# Patient Record
Sex: Male | Born: 1980
Health system: Southern US, Community
[De-identification: ages and names within clinical notes are randomized; demographics above are authoritative.]

## PROBLEM LIST (undated history)

## (undated) DIAGNOSIS — K219 Gastro-esophageal reflux disease without esophagitis: Secondary | ICD-10-CM

## (undated) DIAGNOSIS — G5601 Carpal tunnel syndrome, right upper limb: Secondary | ICD-10-CM

## (undated) DIAGNOSIS — Z87442 Personal history of urinary calculi: Secondary | ICD-10-CM

## (undated) DIAGNOSIS — I10 Essential (primary) hypertension: Secondary | ICD-10-CM

## (undated) DIAGNOSIS — Z87898 Personal history of other specified conditions: Secondary | ICD-10-CM

## (undated) DIAGNOSIS — R350 Frequency of micturition: Secondary | ICD-10-CM

## (undated) DIAGNOSIS — E669 Obesity, unspecified: Secondary | ICD-10-CM

## (undated) DIAGNOSIS — K760 Fatty (change of) liver, not elsewhere classified: Secondary | ICD-10-CM

---

## 2009-03-25 ENCOUNTER — Emergency Department (HOSPITAL_COMMUNITY): Admission: EM | Admit: 2009-03-25 | Discharge: 2009-03-25 | Payer: Self-pay | Admitting: Emergency Medicine

## 2010-11-19 LAB — CBC
MCV: 93.8 fL (ref 78.0–100.0)
Platelets: 286 10*3/uL (ref 150–400)
RBC: 4.1 MIL/uL — ABNORMAL LOW (ref 4.22–5.81)
WBC: 10.1 10*3/uL (ref 4.0–10.5)

## 2010-11-19 LAB — POCT I-STAT, CHEM 8
BUN: 14 mg/dL (ref 6–23)
Chloride: 110 mEq/L (ref 96–112)
Creatinine, Ser: 1.1 mg/dL (ref 0.4–1.5)
Glucose, Bld: 114 mg/dL — ABNORMAL HIGH (ref 70–99)
Potassium: 3.7 mEq/L (ref 3.5–5.1)
Sodium: 142 mEq/L (ref 135–145)

## 2010-11-19 LAB — URINALYSIS, ROUTINE W REFLEX MICROSCOPIC
Bilirubin Urine: NEGATIVE
Nitrite: NEGATIVE
Specific Gravity, Urine: 1.027 (ref 1.005–1.030)
Urobilinogen, UA: 0.2 mg/dL (ref 0.0–1.0)
pH: 5.5 (ref 5.0–8.0)

## 2010-11-19 LAB — DIFFERENTIAL
Lymphocytes Relative: 15 % (ref 12–46)
Lymphs Abs: 1.5 10*3/uL (ref 0.7–4.0)
Monocytes Relative: 7 % (ref 3–12)
Neutrophils Relative %: 74 % (ref 43–77)

## 2010-11-19 LAB — URINE MICROSCOPIC-ADD ON

## 2011-08-14 DIAGNOSIS — G5601 Carpal tunnel syndrome, right upper limb: Secondary | ICD-10-CM

## 2011-08-14 HISTORY — DX: Carpal tunnel syndrome, right upper limb: G56.01

## 2011-08-15 ENCOUNTER — Other Ambulatory Visit: Payer: Self-pay | Admitting: Orthopedic Surgery

## 2011-08-16 ENCOUNTER — Other Ambulatory Visit: Payer: Self-pay | Admitting: Orthopedic Surgery

## 2011-08-16 ENCOUNTER — Encounter (HOSPITAL_BASED_OUTPATIENT_CLINIC_OR_DEPARTMENT_OTHER): Payer: Self-pay | Admitting: *Deleted

## 2011-08-21 ENCOUNTER — Ambulatory Visit (HOSPITAL_BASED_OUTPATIENT_CLINIC_OR_DEPARTMENT_OTHER)
Admission: RE | Admit: 2011-08-21 | Payer: BC Managed Care – PPO | Source: Ambulatory Visit | Admitting: Orthopedic Surgery

## 2011-08-21 ENCOUNTER — Encounter (HOSPITAL_BASED_OUTPATIENT_CLINIC_OR_DEPARTMENT_OTHER): Admission: RE | Payer: Self-pay | Source: Ambulatory Visit

## 2011-08-21 HISTORY — DX: Carpal tunnel syndrome, right upper limb: G56.01

## 2011-08-21 HISTORY — DX: Personal history of urinary calculi: Z87.442

## 2011-08-21 SURGERY — CARPAL TUNNEL RELEASE
Anesthesia: Regional | Laterality: Right

## 2013-02-09 ENCOUNTER — Ambulatory Visit: Payer: 59

## 2013-02-09 ENCOUNTER — Ambulatory Visit: Payer: 59 | Admitting: Family Medicine

## 2013-02-09 VITALS — BP 130/78 | HR 81 | Temp 98.7°F | Resp 18 | Ht 71.0 in | Wt 198.0 lb

## 2013-02-09 DIAGNOSIS — M549 Dorsalgia, unspecified: Secondary | ICD-10-CM

## 2013-02-09 DIAGNOSIS — H109 Unspecified conjunctivitis: Secondary | ICD-10-CM

## 2013-02-09 DIAGNOSIS — R8281 Pyuria: Secondary | ICD-10-CM

## 2013-02-09 DIAGNOSIS — R300 Dysuria: Secondary | ICD-10-CM

## 2013-02-09 DIAGNOSIS — R82998 Other abnormal findings in urine: Secondary | ICD-10-CM

## 2013-02-09 LAB — POCT URINALYSIS DIPSTICK
Bilirubin, UA: NEGATIVE
Glucose, UA: NEGATIVE
Ketones, UA: NEGATIVE
Nitrite, UA: NEGATIVE
Protein, UA: NEGATIVE
Spec Grav, UA: 1.025
Urobilinogen, UA: 0.2
pH, UA: 5.5

## 2013-02-09 LAB — POCT UA - MICROSCOPIC ONLY
Casts, Ur, LPF, POC: NEGATIVE
Crystals, Ur, HPF, POC: NEGATIVE
Epithelial cells, urine per micros: NEGATIVE
Mucus, UA: NEGATIVE
Yeast, UA: NEGATIVE

## 2013-02-09 MED ORDER — TOBRAMYCIN 0.3 % OP SOLN: 1.0000 [drp] | OPHTHALMIC | Status: DC

## 2013-02-09 MED ORDER — HYDROCODONE-ACETAMINOPHEN 5-325 MG PO TABS: 1.0000 | ORAL_TABLET | Freq: Four times a day (QID) | ORAL | Status: DC | PRN

## 2013-02-09 MED ORDER — CIPROFLOXACIN HCL 500 MG PO TABS: 500.0000 mg | ORAL_TABLET | Freq: Two times a day (BID) | ORAL | Status: DC

## 2013-02-09 NOTE — Progress Notes (Signed)
Is a 32 year old gentleman who works at a [employer] in [employer]. He comes in with 2 days of back pain permanently on the left and injection of his left eye with mattering.  Patient's had a kidney stone the past and believes that he saw some hematuria 2 days ago. He's now having just dysuria and a mild ache in the left flank. He's had a fever and has no discharge.  Left eye has been red and and irritated the last day. He had mattering this morning. Right eye is not affected. He's able to see fairly well.  Patient usually works a 3-11 shift.  Objective: No acute distress HEENT: Unremarkable with exception of injected left eye and some mild swelling on the edges of the lips. Abdomen: Soft nontender without HSM. No CVA tenderness. Straight-leg raising negative Hip range of motion and knee range of motion are normal. Skin: Warm and dry without rashes  Results for orders placed in visit on 02/09/13  POCT UA - MICROSCOPIC ONLY      Result Value Range   WBC, Ur, HPF, POC 18-25     RBC, urine, microscopic 6-8     Bacteria, U Microscopic 1+     Mucus, UA neg     Epithelial cells, urine per micros neg     Crystals, Ur, HPF, POC neg     Casts, Ur, LPF, POC neg     Yeast, UA neg     [facility] reading (PRIMARY) by  Dr. Milus Glazier:  KUB.  No stone seen  Back pain - Plan: POCT UA - Microscopic Only, POCT urinalysis dipstick, DG Abd 1 View, HYDROcodone-acetaminophen (NORCO) 5-325 MG per tablet  Conjunctivitis - Plan: tobramycin (TOBREX) 0.3 % ophthalmic solution  Pyuria - Plan: Urine culture, ciprofloxacin (CIPRO) 500 MG tablet  Signed, Elvina Sidle, MD

## 2013-02-09 NOTE — Patient Instructions (Addendum)

## 2013-02-10 LAB — URINE CULTURE
Colony Count: NO GROWTH
Organism ID, Bacteria: NO GROWTH

## 2013-06-18 ENCOUNTER — Ambulatory Visit: Payer: 59 | Admitting: Family Medicine

## 2013-06-18 VITALS — BP 124/82 | HR 88 | Temp 98.5°F | Resp 20 | Ht 70.0 in | Wt 208.8 lb

## 2013-06-18 DIAGNOSIS — R21 Rash and other nonspecific skin eruption: Secondary | ICD-10-CM

## 2013-06-18 LAB — POCT CBC
Granulocyte percent: 73.3 %G (ref 37–80)
HCT, POC: 45.4 % (ref 43.5–53.7)
MCH, POC: 32 pg — AB (ref 27–31.2)
MCV: 98.6 fL — AB (ref 80–97)
POC LYMPH PERCENT: 20.3 %L (ref 10–50)
RBC: 4.6 M/uL — AB (ref 4.69–6.13)
RDW, POC: 13.1 %
WBC: 9 10*3/uL (ref 4.6–10.2)

## 2013-06-18 LAB — COMPREHENSIVE METABOLIC PANEL
Albumin: 4.7 g/dL (ref 3.5–5.2)
BUN: 9 mg/dL (ref 6–23)
CO2: 27 mEq/L (ref 19–32)
Glucose, Bld: 90 mg/dL (ref 70–99)
Potassium: 4.7 mEq/L (ref 3.5–5.3)
Sodium: 140 mEq/L (ref 135–145)
Total Protein: 7.7 g/dL (ref 6.0–8.3)

## 2013-06-18 MED ORDER — PREDNISONE 20 MG PO TABS: ORAL_TABLET | ORAL | Status: DC

## 2013-06-18 NOTE — Patient Instructions (Signed)
I will be in touch as soon as I have any more information about your labs.  Please use the prednisone as directed.  You can also use benadryl cream or by mouth as needed for itching . Try to go easy on your skin- avoid any harsh soaps or creams.  Take short, lukewarm showers and pat your skin dry.    If you are getting worse please let me know right away

## 2013-06-18 NOTE — Progress Notes (Addendum)
[facility] [address] [place] [address] [id number] [phone]  Date:  06/18/2013   Name:  BOBBYE Black   DOB:  12/15/1980   MRN:  [id number]  PCP:  No primary provider on file.    Chief Complaint: Rash   History of Present Illness:  [place] Black is a 32 y.o. very pleasant male patient who presents with the following:  He is here today with a rash- he noted onset of an itchy rash on his left trunk about 2 days aog.  It seems to be spreading to other parts of his body.  It is now present across his chest and in both axillae.  Today he noted a few scattered patches on his face.  He did move into a new house about one week ago- otherwise he is not aware of any new allergen exposures.  He is not on any medications, no new foods.  He lives alone.   No lip or tongue swelling, no difficulty breathing, no wheezing.    He did have to crawl under his house recently to change a water filter.    He does admit that he had a sore throat a few days ago- however he did not think that he had a fever and his sx resolved.  His throat now feels fine.    He is generally quite healthy.  He does tend towards itchy, allergic skin and dry skin.    There are no active problems to display for this patient.   Past Medical History  Diagnosis Date  . Carpal tunnel syndrome, right 08/2011  . History of kidney stones 1-2 yrs. ago    History reviewed. No pertinent past surgical history.  History  Substance Use Topics  . Smoking status: Never Smoker   . Smokeless tobacco: Never Used     Comment: quit smoking 2 yrs. ago  . Alcohol Use: Yes     Comment: 2 x/week    Family History  Problem Relation Age of Onset  . Diabetes Paternal Grandmother     No Known Allergies  Medication list has been reviewed and updated.  Current Outpatient Prescriptions on File Prior to Visit  Medication Sig Dispense Refill  . ibuprofen (ADVIL,MOTRIN) 200 MG tablet Take 200 mg by mouth every 6  (six) hours as needed.        . ciprofloxacin (CIPRO) 500 MG tablet Take 1 tablet (500 mg total) by mouth 2 (two) times daily.  20 tablet  0  . HYDROcodone-acetaminophen (NORCO) 5-325 MG per tablet Take 1 tablet by mouth every 6 (six) hours as needed for pain.  30 tablet  0  . tobramycin (TOBREX) 0.3 % ophthalmic solution Place 1 drop into the left eye every 4 (four) hours.  5 mL  0   No current facility-administered medications on file prior to visit.    Review of Systems:  As per HPI- otherwise negative.   Physical Examination: Filed Vitals:   06/18/13 1312  BP: 124/82  Pulse: 88  Temp: 98.5 F (36.9 C)  Resp: 20   Filed Vitals:   06/18/13 1312  Height: 5\' 10"  (1.778 m)  Weight: 208 lb 12.8 oz (94.711 kg)   Body mass index is 29.96 kg/(m^2). Ideal Body Weight: Weight in (lb) to have BMI = 25: 173.9  GEN: WDWN, NAD, Non-toxic, A & O x 3, overweight, looks well HEENT: Atraumatic, Normocephalic. Neck supple. No masses, No LAD.  Bilateral TM wnl, oropharynx normal.  PEERL,EOMI.  No angioedema or lip/ tongue swelling.   Ears and Nose: No external deformity. CV: RRR, No M/G/R. No JVD. No thrill. No extra heart sounds. PULM: CTA B, no wheezes, crackles, rhonchi. No retractions. No resp. distress. No accessory muscle use. Skin: palpable, fine rash over his trunk, more in the axillae.  He also has a few small macules on his face under the left eye.  Not interfering with his globe or lids.  Palms are spared, no vesicles.  No urticaria.   EXTR: No c/c/e NEURO Normal gait.  PSYCH: Normally interactive. Conversant. Not depressed or anxious appearing.  Calm demeanor.   Results for orders placed in visit on 06/18/13  POCT RAPID STREP A (OFFICE)      Result Value Range   Rapid Strep A Screen Negative  Negative  POCT CBC      Result Value Range   WBC 9.0  4.6 - 10.2 K/uL   Lymph, poc 1.8  0.6 - 3.4   POC LYMPH PERCENT 20.3  10 - 50 %L   MID (cbc) 0.6  0 - 0.9   POC MID % 6.4  0 -  12 %M   POC Granulocyte 6.6  2 - 6.9   Granulocyte percent 73.3  37 - 80 %G   RBC 4.60 (*) 4.69 - 6.13 M/uL   Hemoglobin 14.7  14.1 - 18.1 g/dL   HCT, POC 16.1  09.6 - 53.7 %   MCV 98.6 (*) 80 - 97 fL   MCH, POC 32.0 (*) 27 - 31.2 pg   MCHC 32.4  31.8 - 35.4 g/dL   RDW, POC 04.5     Platelet Count, POC 328  142 - 424 K/uL   MPV 10.0  0 - 99.8 fL  POCT SEDIMENTATION RATE      Result Value Range   POCT SED RATE 31 (*) 0 - 22 mm/hr     Assessment and Plan: Rash and nonspecific skin eruption - Plan: POCT rapid strep A, Culture, Group A Strep, predniSONE (DELTASONE) 20 MG tablet, POCT CBC, POCT SEDIMENTATION RATE, Comprehensive metabolic panel, RPR, HIV antibody, Antistreptolysin O titer  Possible strep related rash- await ASO and throat culture.  Will treat with prednisone for presumed allergic eczema/ atopic dermatitis flare.  Will contact him with the rest of his results.  He is to let me know if getting worse or if sx change.    Signed Abbe Amsterdam, MD  Received the rest of his labs, called the check on him.  He is improved, thinks that his sx are going away.  He has a few days of prednisone left.   He will let me know if his rash comes back.   Results for orders placed in visit on 06/18/13  CULTURE, GROUP A STREP      Result Value Range   Organism ID, Bacteria Normal Upper Respiratory Flora     Organism ID, Bacteria No Beta Hemolytic Streptococci Isolated    COMPREHENSIVE METABOLIC PANEL      Result Value Range   Sodium 140  135 - 145 mEq/L   Potassium 4.7  3.5 - 5.3 mEq/L   Chloride 104  96 - 112 mEq/L   CO2 27  19 - 32 mEq/L   Glucose, Bld 90  70 - 99 mg/dL   BUN 9  6 - 23 mg/dL   Creat 4.09  8.11 - 9.14 mg/dL   Total Bilirubin 0.5  0.3 - 1.2 mg/dL   Alkaline  Phosphatase 64  39 - 117 U/L   AST 36  0 - 37 U/L   ALT 64 (*) 0 - 53 U/L   Total Protein 7.7  6.0 - 8.3 g/dL   Albumin 4.7  3.5 - 5.2 g/dL   Calcium 21.3  8.4 - 08.6 mg/dL  RPR      Result Value Range    RPR NON REAC  NON REAC  HIV ANTIBODY (ROUTINE TESTING)      Result Value Range   HIV NON REACTIVE  NON REACTIVE  ANTISTREPTOLYSIN O TITER      Result Value Range   ASO 87  <409 IU/mL  POCT RAPID STREP A (OFFICE)      Result Value Range   Rapid Strep A Screen Negative  Negative  POCT CBC      Result Value Range   WBC 9.0  4.6 - 10.2 K/uL   Lymph, poc 1.8  0.6 - 3.4   POC LYMPH PERCENT 20.3  10 - 50 %L   MID (cbc) 0.6  0 - 0.9   POC MID % 6.4  0 - 12 %M   POC Granulocyte 6.6  2 - 6.9   Granulocyte percent 73.3  37 - 80 %G   RBC 4.60 (*) 4.69 - 6.13 M/uL   Hemoglobin 14.7  14.1 - 18.1 g/dL   HCT, POC 57.8  46.9 - 53.7 %   MCV 98.6 (*) 80 - 97 fL   MCH, POC 32.0 (*) 27 - 31.2 pg   MCHC 32.4  31.8 - 35.4 g/dL   RDW, POC 62.9     Platelet Count, POC 328  142 - 424 K/uL   MPV 10.0  0 - 99.8 fL  POCT SEDIMENTATION RATE      Result Value Range   POCT SED RATE 31 (*) 0 - 22 mm/hr

## 2013-06-19 LAB — RPR

## 2013-06-19 LAB — HIV ANTIBODY (ROUTINE TESTING W REFLEX): HIV: NONREACTIVE

## 2013-06-20 ENCOUNTER — Encounter: Payer: Self-pay | Admitting: Family Medicine

## 2013-06-20 LAB — CULTURE, GROUP A STREP: Organism ID, Bacteria: NORMAL

## 2013-06-21 ENCOUNTER — Encounter: Payer: Self-pay | Admitting: Family Medicine

## 2015-05-23 ENCOUNTER — Encounter: Payer: Self-pay | Admitting: Sports Medicine

## 2015-05-23 ENCOUNTER — Ambulatory Visit (INDEPENDENT_AMBULATORY_CARE_PROVIDER_SITE_OTHER): Payer: 59 | Admitting: Sports Medicine

## 2015-05-23 VITALS — BP 147/94 | HR 72 | Ht 72.0 in | Wt 223.0 lb

## 2015-05-23 DIAGNOSIS — E669 Obesity, unspecified: Secondary | ICD-10-CM | POA: Insufficient documentation

## 2015-05-23 DIAGNOSIS — Z0000 Encounter for general adult medical examination without abnormal findings: Secondary | ICD-10-CM | POA: Diagnosis not present

## 2015-05-23 MED ORDER — PHENTERMINE HCL 37.5 MG PO TABS: ORAL_TABLET | ORAL | Status: DC

## 2015-05-23 NOTE — Assessment & Plan Note (Signed)
Starting phentermine, nutrition referral, exercise prescription given. Return monthly for weight checks and refills. 

## 2015-05-23 NOTE — Assessment & Plan Note (Signed)
Checking routine blood work. Working on weight loss.

## 2015-05-23 NOTE — Progress Notes (Signed)
  Subjective:    CC: Establish care.   HPI:  [place]unter Black is a pleasant 34 year old male, he has struggled with his weight for most of his life, he has tried dieting and exercise but can never seen to lose a significant amount of weight. Symptoms have begun to way on him, and he does endorse some knee pain.  Past medical history, Surgical history, Family history not pertinant except as noted below, Social history, Allergies, and medications have been entered into the medical record, reviewed, and no changes needed.   Review of Systems: No headache, visual changes, nausea, vomiting, diarrhea, constipation, dizziness, abdominal pain, skin rash, fevers, chills, night sweats, swollen lymph nodes, weight loss, chest pain, body aches, joint swelling, muscle aches, shortness of breath, mood changes, visual or auditory hallucinations.  Objective:    General: Well Developed, well nourished, and in no acute distress.  Neuro: Alert and oriented x3, extra-ocular muscles intact, sensation grossly intact.  HEENT: Normocephalic, atraumatic, pupils equal round reactive to light, neck supple, no masses, no lymphadenopathy, thyroid nonpalpable.  Skin: Warm and dry, no rashes noted.  Cardiac: Regular rate and rhythm, no murmurs rubs or gallops.  Respiratory: Clear to auscultation bilaterally. Not using accessory muscles, speaking in full sentences.  Abdominal: Soft, nontender, nondistended, positive bowel sounds, no masses, no organomegaly.  Musculoskeletal: Shoulder, elbow, wrist, hip, knee, ankle stable, and with full range of motion.  Impression and Recommendations:    The patient was counselled, risk factors were discussed, anticipatory guidance given.

## 2015-06-20 ENCOUNTER — Ambulatory Visit: Payer: 59 | Admitting: Sports Medicine

## 2017-10-11 DIAGNOSIS — Z87898 Personal history of other specified conditions: Secondary | ICD-10-CM

## 2017-10-11 HISTORY — DX: Personal history of other specified conditions: Z87.898

## 2017-10-29 ENCOUNTER — Other Ambulatory Visit: Payer: Self-pay

## 2017-10-29 ENCOUNTER — Emergency Department (HOSPITAL_COMMUNITY): Payer: 59

## 2017-10-29 ENCOUNTER — Emergency Department (HOSPITAL_COMMUNITY)
Admission: EM | Admit: 2017-10-29 | Discharge: 2017-10-29 | Disposition: A | Discharge status: Home / Self Care | Payer: 59 | Attending: Emergency Medicine | Admitting: Emergency Medicine

## 2017-10-29 ENCOUNTER — Encounter (HOSPITAL_COMMUNITY): Payer: Self-pay | Admitting: *Deleted

## 2017-10-29 DIAGNOSIS — Z79899 Other long term (current) drug therapy: Secondary | ICD-10-CM | POA: Diagnosis not present

## 2017-10-29 DIAGNOSIS — R339 Retention of urine, unspecified: Secondary | ICD-10-CM

## 2017-10-29 DIAGNOSIS — R1084 Generalized abdominal pain: Secondary | ICD-10-CM | POA: Diagnosis not present

## 2017-10-29 LAB — BASIC METABOLIC PANEL
ANION GAP: 11 (ref 5–15)
BUN: 8 mg/dL (ref 6–20)
CALCIUM: 9.9 mg/dL (ref 8.9–10.3)
CO2: 25 mmol/L (ref 22–32)
CREATININE: 0.99 mg/dL (ref 0.61–1.24)
Chloride: 100 mmol/L — ABNORMAL LOW (ref 101–111)
GFR calc non Af Amer: >60 mL/min (ref >60)
Glucose, Bld: 108 mg/dL — ABNORMAL HIGH (ref 65–99)
Potassium: 3.8 mmol/L (ref 3.5–5.1)
SODIUM: 136 mmol/L (ref 135–145)

## 2017-10-29 LAB — URINALYSIS, ROUTINE W REFLEX MICROSCOPIC
Bilirubin Urine: NEGATIVE
Glucose, UA: NEGATIVE mg/dL
HGB URINE DIPSTICK: NEGATIVE
KETONES UR: NEGATIVE mg/dL
Leukocytes, UA: NEGATIVE
Nitrite: POSITIVE — AB
PROTEIN: NEGATIVE mg/dL
Specific Gravity, Urine: 1.014 (ref 1.005–1.030)
pH: 5 (ref 5.0–8.0)

## 2017-10-29 MED ORDER — AZITHROMYCIN 250 MG PO TABS
1000.0000 mg | ORAL_TABLET | Freq: Once | ORAL | Status: AC
  Administered 2017-10-29: 1000 mg via ORAL
  Filled 2017-10-29: qty 4

## 2017-10-29 MED ORDER — LIDOCAINE HCL (PF) 1 % IJ SOLN
INTRAMUSCULAR | Status: AC
  Administered 2017-10-29: 0.9 mL
  Filled 2017-10-29: qty 5

## 2017-10-29 MED ORDER — KETOROLAC TROMETHAMINE 30 MG/ML IJ SOLN
15.0000 mg | Freq: Once | INTRAMUSCULAR | Status: AC
  Administered 2017-10-29: 15 mg via INTRAMUSCULAR
  Filled 2017-10-29: qty 1

## 2017-10-29 MED ORDER — CEFTRIAXONE SODIUM 250 MG IJ SOLR
250.0000 mg | Freq: Once | INTRAMUSCULAR | Status: AC
  Administered 2017-10-29: 250 mg via INTRAMUSCULAR
  Filled 2017-10-29: qty 250

## 2017-10-29 MED ORDER — LIDOCAINE HCL (PF) 1 % IJ SOLN
0.9000 mL | Freq: Once | INTRAMUSCULAR | Status: AC
Start: 2017-10-29 — End: 2017-10-29
  Administered 2017-10-29: 0.9 mL

## 2017-10-29 NOTE — ED Triage Notes (Signed)
Pt reports ongoing difficulty urinating. Hx of kidney stones. Pt has been to pcp and had all negative tests. Still has retention, has mild burning once able to urinate.

## 2017-10-29 NOTE — ED Provider Notes (Signed)
[facility] EMERGENCY DEPARTMENT Provider Note   CSN: [id number] Arrival date & time: 10/29/17  4098     History   Chief Complaint Chief Complaint  Patient presents with  . Urinary Retention    HPI [place] Black is a 37 y.o. male.  HPI Presents with concern of difficulty urinating. Patient notes that over the past year or so he has had several similar episodes, and is currently experiencing the severe version of a typical event. Now, over the past few days he has had difficulty initiating a urinary stream, and today, in spite of substantial pressure, has had minimal urine production. There is some associated abdominal fullness in the lower mid abdomen, but no other true abdominal pain. He continues to have bowel movements regularly. He did have a fever 3 days ago, but none in the past 48 hours. He has a history of kidney stone in the distant past, has not seen a urologist. He did see a primary care physician last week, had urinalysis, urine culture, both reportedly unremarkable. During this exacerbation, no relief with anything, and there was no clear precipitating event. Past Medical History:  Diagnosis Date  . Carpal tunnel syndrome, right 08/2011  . History of kidney stones 1-2 yrs. ago    Patient Active Problem List   Diagnosis Date Noted  . Annual physical exam 05/23/2015  . Obesity 05/23/2015    History reviewed. No pertinent surgical history.     Home Medications    Prior to Admission medications   Medication Sig Start Date End Date Taking? Authorizing Provider  lisinopril-hydrochlorothiazide (PRINZIDE,ZESTORETIC) 20-12.5 MG tablet Take 1 tablet by mouth daily. 10/22/17  Yes [provider]  phentermine (ADIPEX-P) 37.5 MG tablet One tab by mouth qAM Patient not taking: Reported on 10/29/2017 05/23/15   Monica Becton, MD  predniSONE (DELTASONE) 20 MG tablet Take 2 pills a day for 3 days, then 1 pill a day for 3 days Patient  not taking: Reported on 10/29/2017 06/18/13   Copland, Gwenlyn Found, MD  Patient recently started hydrochlorothiazide, lisinopril combination antihypertensive.  Family History Family History  Problem Relation Age of Onset  . Diabetes Paternal Grandmother     Social History Social History   Tobacco Use  . Smoking status: Never Smoker  . Smokeless tobacco: Former Engineer, water Use Topics  . Alcohol use: Yes    Alcohol/week: 6.0 - 9.0 oz    Types: 10 - 15 Cans of beer per week    Comment: 2 x/week  . Drug use: No   Patient is heterosexual, has one recent sexual encounter, unprotected sex, usual partner.  Allergies   Patient has no known allergies.   Review of Systems Review of Systems  Constitutional:       Per HPI, otherwise negative  HENT:       Per HPI, otherwise negative  Respiratory:       Per HPI, otherwise negative  Cardiovascular:       Per HPI, otherwise negative  Gastrointestinal: Negative for vomiting.  Endocrine:       Negative aside from HPI  Genitourinary:       Neg aside from HPI   Musculoskeletal:       Per HPI, otherwise negative  Skin: Negative.   Neurological: Negative for syncope.     Physical Exam Updated Vital Signs BP 121/75 (BP Location: Right Arm)   Pulse 81   Temp 98.7 F (37.1 C) (Oral)   Resp 18  Ht 5\' 10"  (1.778 m)   Wt 92.5 kg (204 lb)   SpO2 100%   BMI 29.27 kg/m   Physical Exam  Constitutional: He is oriented to person, place, and time. He appears well-developed. No distress.  HENT:  Head: Normocephalic and atraumatic.  Eyes: Conjunctivae and EOM are normal.  Pulmonary/Chest: Effort normal. No respiratory distress.  Abdominal: He exhibits no distension.  Minimal tenderness about the suprapubic area, no guarding, or no rebound  Musculoskeletal: He exhibits no edema.  Neurological: He is alert and oriented to person, place, and time.  Skin: Skin is warm and dry.  Psychiatric: He has a normal mood and affect.  Nursing  note and vitals reviewed.    ED Treatments / Results  Labs (all labs ordered are listed, but only abnormal results are displayed) Labs Reviewed  URINALYSIS, ROUTINE W REFLEX MICROSCOPIC - Abnormal; Notable for the following components:      Result Value   Color, Urine AMBER (*)    Nitrite POSITIVE (*)    Bacteria, UA RARE (*)    Squamous Epithelial / LPF 0-5 (*)    All other components within normal limits  BASIC METABOLIC PANEL - Abnormal; Notable for the following components:   Chloride 100 (*)    Glucose, Bld 108 (*)    All other components within normal limits  GC/CHLAMYDIA PROBE AMP ([place]) NOT AT [facility]    EKG  EKG Interpretation None       Radiology Ct Renal Stone Study  Result Date: 10/29/2017 CLINICAL DATA:  [age] year old male with left flank pain yesterday. Unable to urinate today. History kidney stones. Initial encounter. EXAM: CT ABDOMEN AND PELVIS WITHOUT CONTRAST TECHNIQUE: Multidetector CT imaging of the abdomen and pelvis was performed following the standard protocol without IV contrast. COMPARISON:  05/09/2015 CT. FINDINGS: Lower chest: No worrisome lung base abnormality. Heart size within normal limits. Hepatobiliary: Enlarged slightly fatty infiltrated liver spanning over 18.5 cm. Taking into account limitation by non contrast imaging, no focal hepatic lesion. Partially contracted gallbladder without calcified gallstone. Pancreas: Taking into account limitation by non contrast imaging, no worrisome pancreatic mass or inflammation. Spleen: Taking into account limitation by non contrast imaging, no splenic mass or enlargement. Adrenals/Urinary Tract: No renal or ureteral obstructing stone or hydronephrosis. Taking into account limitation by non contrast imaging, no renal or adrenal worrisome lesion. Noncontrast imaging of the urinary bladder unremarkable. Stomach/Bowel: No extraluminal bowel inflammatory process. Under distended distal esophagus limiting evaluation.  Vascular/Lymphatic: No aortic aneurysm.  No adenopathy. Reproductive: Tiny prostatic gland calcification. No calcification seen along urethra. Other: No free air or bowel containing hernia. Musculoskeletal: No worrisome osseous abnormality. IMPRESSION: No renal or ureteral obstructing stone or hydronephrosis. No bladder stone or urethral calculus noted. Enlarged slightly fatty infiltrated liver spanning over 18.5 cm. Electronically Signed   By: Lacy DuverneySteven  Olson M.D.   On: 10/29/2017 14:06    Procedures Procedures (including critical care time)  Medications Ordered in ED Medications  cefTRIAXone (ROCEPHIN) injection 250 mg (not administered)  azithromycin (ZITHROMAX) tablet 1,000 mg (not administered)  ketorolac (TORADOL) 30 MG/ML injection 15 mg (not administered)     Initial Impression / Assessment and Plan / ED Course  I have reviewed the triage vital signs and the nursing notes.  Pertinent labs & imaging results that were available during my care of the patient were reviewed by me and considered in my medical decision making (see chart for details).     3:55 PM Patient awake and alert,  in no distress per He has had additional episodes of urination, again with difficulty initiating a stream, incomplete voiding. Urinalysis unremarkable, aside from nitrates, but the patient was previously evaluated with urinalysis with culture which was normal. Culture will be sent. He and I discussed possibilities for his difficulty with urination, including infectious urethritis, neurogenic dysfunction, recent passage of kidney stone. I discussed the option of placement of a Foley catheter, with initiation of anti-inflammatories, urology follow-up. Patient opted for empiric treatment for infection, close outpatient follow-up. As he can initiate a urinary stream, has no evidence for renal dysfunction, but this is a reasonable alternative. Patient acknowledged the importance of return precautions,  follow-up instructions, and after provision of azithromycin, ceftriaxone, Toradol, he was discharged in stable condition with close outpatient follow-up.  Final Clinical Impressions(s) / ED Diagnoses  Urinary retention   Gerhard Munch, MD 10/29/17 1557

## 2017-10-29 NOTE — Discharge Instructions (Signed)
As discussed, you have been diagnosed with urinary retention. This is presumably due to urethritis, but requires additional evaluation and management. Please monitor your condition carefully, take ibuprofen 400 mg, 3 times daily for the next 3 days, and be sure to follow-up with our urology colleagues.  Return here or to our affiliated emergency department for concerning changes, including inability to urinate.

## 2017-10-29 NOTE — ED Notes (Signed)
Bladder scan 327 ml after pt attempted to go to the bathroom.  Pt stated he was only able to urinate a little.

## 2018-04-08 ENCOUNTER — Other Ambulatory Visit: Payer: Self-pay | Admitting: Urology

## 2018-04-18 ENCOUNTER — Encounter (HOSPITAL_BASED_OUTPATIENT_CLINIC_OR_DEPARTMENT_OTHER): Payer: Self-pay

## 2018-04-21 ENCOUNTER — Encounter (HOSPITAL_BASED_OUTPATIENT_CLINIC_OR_DEPARTMENT_OTHER): Payer: Self-pay

## 2018-04-21 ENCOUNTER — Other Ambulatory Visit: Payer: Self-pay

## 2018-04-21 NOTE — Progress Notes (Signed)
Spoke with:  Hunter Black NPO:  After Midnight, no gum, candy, or mints  Arrival time:  [id number] Labs: Istat 8, EKG AM medications: None Pre op orders: Yes Ride home:Hunter Black ([place]) [phone]

## 2018-04-27 NOTE — Anesthesia Preprocedure Evaluation (Addendum)
Anesthesia Evaluation  Patient identified by MRN, date of birth, ID band Patient awake    Reviewed: Allergy & Precautions, NPO status   Airway Mallampati: II  TM Distance: >3 FB Neck ROM: Full    Dental no notable dental hx. (+) Teeth Intact, Dental Advisory Given,    Pulmonary former smoker,    Pulmonary exam normal breath sounds clear to auscultation       Cardiovascular Exercise Tolerance: Good hypertension, Pt. on medications negative cardio ROS Normal cardiovascular exam Rhythm:Regular Rate:Normal     Neuro/Psych negative neurological ROS  negative psych ROS   GI/Hepatic GERD  Medicated and Controlled,  Endo/Other    Renal/GU    Urethral Stricture    Musculoskeletal   Abdominal   Peds  Hematology negative hematology ROS (+)   Anesthesia Other Findings   Reproductive/Obstetrics                          Anesthesia Physical Anesthesia Plan  ASA: II  Anesthesia Plan: General   Post-op Pain Management:    Induction: Intravenous  PONV Risk Score and Plan: Treatment may vary due to age or medical condition, Ondansetron, Dexamethasone and Scopolamine patch - Pre-op  Airway Management Planned: LMA  Additional Equipment:   Intra-op Plan:   Post-operative Plan:   Informed Consent: I have reviewed the patients History and Physical, chart, labs and discussed the procedure including the risks, benefits and alternatives for the proposed anesthesia with the patient or authorized representative who has indicated his/her understanding and acceptance.   Dental advisory given  Plan Discussed with:   Anesthesia Plan Comments:         Anesthesia Quick Evaluation
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
## 2018-04-28 ENCOUNTER — Ambulatory Visit (HOSPITAL_BASED_OUTPATIENT_CLINIC_OR_DEPARTMENT_OTHER)
Admission: RE | Admit: 2018-04-28 | Discharge: 2018-04-28 | Disposition: A | Discharge status: Home / Self Care | Payer: 59 | Source: Ambulatory Visit | Attending: Urology | Admitting: Urology

## 2018-04-28 ENCOUNTER — Other Ambulatory Visit: Payer: Self-pay

## 2018-04-28 ENCOUNTER — Encounter (HOSPITAL_BASED_OUTPATIENT_CLINIC_OR_DEPARTMENT_OTHER): Payer: Self-pay | Admitting: *Deleted

## 2018-04-28 ENCOUNTER — Ambulatory Visit (HOSPITAL_BASED_OUTPATIENT_CLINIC_OR_DEPARTMENT_OTHER): Payer: 59 | Admitting: Anesthesiology

## 2018-04-28 ENCOUNTER — Encounter (HOSPITAL_BASED_OUTPATIENT_CLINIC_OR_DEPARTMENT_OTHER)
Admission: RE | Disposition: A | Discharge status: Home / Self Care | Payer: Self-pay | Source: Ambulatory Visit | Attending: Urology

## 2018-04-28 DIAGNOSIS — K219 Gastro-esophageal reflux disease without esophagitis: Secondary | ICD-10-CM | POA: Insufficient documentation

## 2018-04-28 DIAGNOSIS — R3914 Feeling of incomplete bladder emptying: Secondary | ICD-10-CM | POA: Insufficient documentation

## 2018-04-28 DIAGNOSIS — I10 Essential (primary) hypertension: Secondary | ICD-10-CM | POA: Diagnosis not present

## 2018-04-28 DIAGNOSIS — R310 Gross hematuria: Secondary | ICD-10-CM | POA: Diagnosis not present

## 2018-04-28 DIAGNOSIS — N35912 Unspecified bulbous urethral stricture, male: Secondary | ICD-10-CM

## 2018-04-28 DIAGNOSIS — Z87891 Personal history of nicotine dependence: Secondary | ICD-10-CM | POA: Diagnosis not present

## 2018-04-28 DIAGNOSIS — N35812 Other urethral bulbous stricture, male: Secondary | ICD-10-CM | POA: Insufficient documentation

## 2018-04-28 HISTORY — DX: Obesity, unspecified: E66.9

## 2018-04-28 HISTORY — PX: CYSTOSCOPY WITH RETROGRADE URETHROGRAM: SHX6309

## 2018-04-28 HISTORY — DX: Fatty (change of) liver, not elsewhere classified: K76.0

## 2018-04-28 HISTORY — DX: Essential (primary) hypertension: I10

## 2018-04-28 HISTORY — DX: Personal history of other specified conditions: Z87.898

## 2018-04-28 HISTORY — DX: Gastro-esophageal reflux disease without esophagitis: K21.9

## 2018-04-28 LAB — POCT I-STAT, CHEM 8
BUN: 8 mg/dL (ref 6–20)
Calcium, Ion: 1.23 mmol/L (ref 1.15–1.40)
Chloride: 98 mmol/L (ref 98–111)
Creatinine, Ser: 1.2 mg/dL (ref 0.61–1.24)
Glucose, Bld: 103 mg/dL — ABNORMAL HIGH (ref 70–99)
HCT: 45 % (ref 39.0–52.0)
Hemoglobin: 15.3 g/dL (ref 13.0–17.0)
Potassium: 3.8 mmol/L (ref 3.5–5.1)
Sodium: 137 mmol/L (ref 135–145)
TCO2: 30 mmol/L (ref 22–32)

## 2018-04-28 SURGERY — CYSTOSCOPY WITH RETROGRADE URETHROGRAM
Anesthesia: General | Site: Bladder

## 2018-04-28 MED ORDER — ONDANSETRON HCL 4 MG/2ML IJ SOLN
INTRAMUSCULAR | Status: DC | PRN
  Administered 2018-04-28: 4 mg via INTRAVENOUS

## 2018-04-28 MED ORDER — ONDANSETRON HCL 4 MG/2ML IJ SOLN
INTRAMUSCULAR | Status: AC
  Filled 2018-04-28: qty 2

## 2018-04-28 MED ORDER — FENTANYL CITRATE (PF) 100 MCG/2ML IJ SOLN
INTRAMUSCULAR | Status: AC
  Filled 2018-04-28: qty 4

## 2018-04-28 MED ORDER — MIDAZOLAM HCL 2 MG/2ML IJ SOLN
INTRAMUSCULAR | Status: DC | PRN
  Administered 2018-04-28: 2 mg via INTRAVENOUS

## 2018-04-28 MED ORDER — CIPROFLOXACIN IN D5W 400 MG/200ML IV SOLN
INTRAVENOUS | Status: AC
  Filled 2018-04-28: qty 200

## 2018-04-28 MED ORDER — LACTATED RINGERS IV SOLN
INTRAVENOUS | Status: DC
  Administered 2018-04-28: 08:00:00 via INTRAVENOUS
  Filled 2018-04-28: qty 1000

## 2018-04-28 MED ORDER — PHENAZOPYRIDINE HCL 200 MG PO TABS: 200.0000 mg | ORAL_TABLET | Freq: Three times a day (TID) | ORAL | 0 refills | Status: DC | PRN

## 2018-04-28 MED ORDER — HYDROCODONE-ACETAMINOPHEN 7.5-325 MG PO TABS
1.0000 | ORAL_TABLET | Freq: Once | ORAL | Status: DC | PRN
  Filled 2018-04-28: qty 1

## 2018-04-28 MED ORDER — HYDROMORPHONE HCL 1 MG/ML IJ SOLN
0.2500 mg | INTRAMUSCULAR | Status: DC | PRN
  Filled 2018-04-28: qty 0.5

## 2018-04-28 MED ORDER — IOHEXOL 300 MG/ML  SOLN
INTRAMUSCULAR | Status: DC | PRN
  Administered 2018-04-28: 20 mL

## 2018-04-28 MED ORDER — PROMETHAZINE HCL 25 MG/ML IJ SOLN
6.2500 mg | INTRAMUSCULAR | Status: DC | PRN
  Filled 2018-04-28: qty 1

## 2018-04-28 MED ORDER — LIDOCAINE 2% (20 MG/ML) 5 ML SYRINGE
INTRAMUSCULAR | Status: DC | PRN
  Administered 2018-04-28: 100 mg via INTRAVENOUS

## 2018-04-28 MED ORDER — MEPERIDINE HCL 25 MG/ML IJ SOLN
6.2500 mg | INTRAMUSCULAR | Status: DC | PRN
  Filled 2018-04-28: qty 1

## 2018-04-28 MED ORDER — HYDROCODONE-ACETAMINOPHEN 10-325 MG PO TABS: 1.0000 | ORAL_TABLET | ORAL | 0 refills | Status: DC | PRN

## 2018-04-28 MED ORDER — LIDOCAINE 2% (20 MG/ML) 5 ML SYRINGE
INTRAMUSCULAR | Status: AC
  Filled 2018-04-28: qty 5

## 2018-04-28 MED ORDER — MIDAZOLAM HCL 2 MG/2ML IJ SOLN
INTRAMUSCULAR | Status: AC
  Filled 2018-04-28: qty 2

## 2018-04-28 MED ORDER — STERILE WATER FOR IRRIGATION IR SOLN
Status: DC | PRN
  Administered 2018-04-28: 3000 mL

## 2018-04-28 MED ORDER — DEXAMETHASONE SODIUM PHOSPHATE 10 MG/ML IJ SOLN
INTRAMUSCULAR | Status: AC
  Filled 2018-04-28: qty 1

## 2018-04-28 MED ORDER — PROPOFOL 10 MG/ML IV BOLUS
INTRAVENOUS | Status: DC | PRN
  Administered 2018-04-28: 200 mg via INTRAVENOUS

## 2018-04-28 MED ORDER — DEXAMETHASONE SODIUM PHOSPHATE 10 MG/ML IJ SOLN
INTRAMUSCULAR | Status: DC | PRN
  Administered 2018-04-28: 5 mg via INTRAVENOUS

## 2018-04-28 MED ORDER — ACETAMINOPHEN 10 MG/ML IV SOLN
1000.0000 mg | Freq: Once | INTRAVENOUS | Status: DC | PRN
  Filled 2018-04-28: qty 100

## 2018-04-28 MED ORDER — PROPOFOL 10 MG/ML IV BOLUS
INTRAVENOUS | Status: AC
  Filled 2018-04-28: qty 20

## 2018-04-28 MED ORDER — CIPROFLOXACIN IN D5W 400 MG/200ML IV SOLN
400.0000 mg | Freq: Once | INTRAVENOUS | Status: AC
  Administered 2018-04-28: 400 mg via INTRAVENOUS
  Filled 2018-04-28: qty 200

## 2018-04-28 MED ORDER — FENTANYL CITRATE (PF) 100 MCG/2ML IJ SOLN
INTRAMUSCULAR | Status: DC | PRN
  Administered 2018-04-28 (×2): 50 ug via INTRAVENOUS

## 2018-04-28 MED FILL — HYDROCODON-APAP 10-325: 10-325 | 1 days supply | Qty: 8 | Fill #0

## 2018-04-28 SURGICAL SUPPLY — 27 items
BAG DRAIN URO-CYSTO SKYTR STRL (DRAIN) ×2 IMPLANT
BAG DRN ANRFLXCHMBR STRAP LEK (BAG) ×1
BAG DRN UROCATH (DRAIN) ×1
BAG URINE LEG 19OZ MD ST LTX (BAG) ×1 IMPLANT
BALLN NEPHROSTOMY (BALLOONS) ×2
BALLOON NEPHROSTOMY (BALLOONS) IMPLANT
CATH FOLEY 2WAY SLVR  5CC 14FR (CATHETERS) ×1
CATH FOLEY 2WAY SLVR  5CC 16FR (CATHETERS) ×1
CATH FOLEY 2WAY SLVR 5CC 14FR (CATHETERS) IMPLANT
CATH FOLEY 2WAY SLVR 5CC 16FR (CATHETERS) IMPLANT
CLOTH BEACON ORANGE TIMEOUT ST (SAFETY) ×4 IMPLANT
GLOVE BIO SURGEON STRL SZ7 (GLOVE) ×2 IMPLANT
GLOVE BIO SURGEON STRL SZ8 (GLOVE) ×2 IMPLANT
GLOVE BIOGEL PI IND STRL 7.5 (GLOVE) IMPLANT
GLOVE BIOGEL PI INDICATOR 7.5 (GLOVE) ×2
GOWN STRL REUS W/ TWL XL LVL3 (GOWN DISPOSABLE) ×1 IMPLANT
GOWN STRL REUS W/TWL LRG LVL3 (GOWN DISPOSABLE) ×2 IMPLANT
GOWN STRL REUS W/TWL XL LVL3 (GOWN DISPOSABLE) ×2
GUIDEWIRE STR DUAL SENSOR (WIRE) ×1 IMPLANT
IV NS IRRIG 3000ML ARTHROMATIC (IV SOLUTION) IMPLANT
KIT TURNOVER CYSTO (KITS) ×2 IMPLANT
MANIFOLD NEPTUNE II (INSTRUMENTS) ×1 IMPLANT
PACK CYSTO (CUSTOM PROCEDURE TRAY) ×2 IMPLANT
SYR 20CC LL (SYRINGE) IMPLANT
SYR 30ML LL (SYRINGE) IMPLANT
TUBE CONNECTING 12X1/4 (SUCTIONS) ×1 IMPLANT
WATER STERILE IRR 3000ML UROMA (IV SOLUTION) ×2 IMPLANT

## 2018-04-28 NOTE — Anesthesia Postprocedure Evaluation (Signed)
Anesthesia Post Note  Patient: Hunter LofflerDerek M Black  Procedure(s) Performed: CYSTOSCOPY WITH RETROGRADE URETHROGRAM/ BALLOON DILATION (N/A Bladder)     Patient location during evaluation: PACU Anesthesia Type: General Level of consciousness: awake and alert Pain management: pain level controlled Vital Signs Assessment: post-procedure vital signs reviewed and stable Respiratory status: spontaneous breathing, nonlabored ventilation, respiratory function stable and patient connected to nasal cannula oxygen Cardiovascular status: blood pressure returned to baseline and stable Postop Assessment: no apparent nausea or vomiting Anesthetic complications: no    Last Vitals:  Vitals:   04/28/18 0930 04/28/18 0945  BP:  115/85  Pulse: 75 78  Resp: (!) 22 17  Temp:    SpO2: 97% 93%    Last Pain:  Vitals:   04/28/18 0930  TempSrc:   PainSc: 0-No pain                 Trevor IhaStephen A Jasmen Emrich
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
## 2018-04-28 NOTE — Transfer of Care (Signed)
Immediate Anesthesia Transfer of Care Note  Patient: Hunter Black  Procedure(s) Performed: CYSTOSCOPY WITH RETROGRADE URETHROGRAM/ BALLOON DILATION (N/A Bladder)  Patient Location: PACU  Anesthesia Type:General  Level of Consciousness: awake, alert , oriented and patient cooperative  Airway & Oxygen Therapy: Patient Spontanous Breathing and Patient connected to nasal cannula oxygen  Post-op Assessment: Report given to RN and Post -op Vital signs reviewed and stable  Post vital signs: Reviewed and stable  Last Vitals:  Vitals Value Taken Time  BP 125/88 04/28/2018  9:17 AM  Temp    Pulse 81 04/28/2018  9:19 AM  Resp    SpO2 97 % 04/28/2018  9:19 AM  Vitals shown include unvalidated device data.  Last Pain:  Vitals:   04/28/18 0709  TempSrc:   PainSc: 0-No pain      Patients Stated Pain Goal: 7 (04/28/18 0709)  Complications: No apparent anesthesia complications

## 2018-04-28 NOTE — Anesthesia Procedure Notes (Signed)
Procedure Name: LMA Insertion Date/Time: 04/28/2018 8:49 AM Performed by: Tyrone NineSauve, Tomi Paddock F, CRNA Pre-anesthesia Checklist: Patient identified, Timeout performed, Emergency Drugs available, Suction available and Patient being monitored Patient Re-evaluated:Patient Re-evaluated prior to induction Oxygen Delivery Method: Circle system utilized Induction Type: IV induction Ventilation: Mask ventilation without difficulty LMA: LMA inserted LMA Size: 4.0 Number of attempts: 1 Placement Confirmation: CO2 detector,  positive ETCO2 and breath sounds checked- equal and bilateral Tube secured with: Tape Dental Injury: Teeth and Oropharynx as per pre-operative assessment

## 2018-04-28 NOTE — Discharge Instructions (Addendum)
Cystoscopy patient instructions  Following a cystoscopy, a catheter (a flexible rubber tube) is sometimes left in place to empty the bladder. This may cause some discomfort or a feeling that you need to urinate. Your doctor determines the period of time that the catheter will be left in place. You may have bloody urine for two to three days (Call your doctor if the amount of bleeding increases or does not subside).  You may pass blood clots in your urine, especially if you had a biopsy. It is not unusual to pass small blood clots and have some bloody urine a couple of weeks after your cystoscopy. Again, call your doctor if the bleeding does not subside. You may have: Dysuria (painful urination) Frequency (urinating often) Urgency (strong desire to urinate)  Removal of catheter Remove the foley catheter after 24 hours ( day after the procedure).can be done easily by cutting the side port of the catheter, which will allow the balloon to deflate.  You will see 1-2 teaspoons of clear water as the balloon deflates and then the catheter can be slid out without difficulty.        Cut here    These symptoms are common especially if medicine is instilled into the bladder or a ureteral stent is placed. Avoiding alcohol and caffeine, such as coffee, tea, and chocolate, may help relieve these symptoms. Drink plenty of water, unless otherwise instructed. Your doctor may also prescribe an antibiotic or other medicine to reduce these symptoms.  Cystoscopy results are available soon after the procedure; biopsy results usually take two to four days. Your doctor will discuss the results of your exam with you. Before you go home, you will be given specific instructions for follow-up care. Special Instructions:  1 If you are going home with a catheter in place do not take a tub bath until removed by your doctor.  2 You may resume your normal activities.  3 Do not drive or operate machinery if you are taking  narcotic pain medicine.  4 Be sure to keep all follow-up appointments with your doctor.   5 Call Your Doctor If: The catheter is not draining  You have severe pain  You are unable to urinate  You have a fever over 101  You have severe bleeding          Indwelling Urinary Catheter Care, Adult Take good care of your catheter to keep it working and to prevent problems. How to wear your catheter Attach your catheter to your leg with tape (adhesive tape) or a leg strap. Make sure it is not too tight. If you use tape, remove any bits of tape that are already on the catheter. How to wear a drainage bag You should have: A large overnight bag. A small leg bag.  Overnight Bag You may wear the overnight bag at any time. Always keep the bag below the level of your bladder but off the floor. When you sleep, put a clean plastic bag in a wastebasket. Then hang the bag inside the wastebasket. Leg Bag Never wear the leg bag at night. Always wear the leg bag below your knee. Keep the leg bag secure with a leg strap or tape. How to care for your skin Clean the skin around the catheter at least once every day. Shower every day. Do not take baths. Put creams, lotions, or ointments on your genital area only as told by your doctor. Do not use powders, sprays, or lotions on your genital  area. How to clean your catheter and your skin Wash your hands with soap and water. Wet a washcloth in warm water and gentle (mild) soap. Use the washcloth to clean the skin where the catheter enters your body. Clean downward and wipe away from the catheter in small circles. Do not wipe toward the catheter. Pat the area dry with a clean towel. Make sure to clean off all soap. How to care for your drainage bags Empty your drainage bag when it is ?- full or at least 2-3 times a day. Replace your drainage bag once a month or sooner if it starts to smell bad or look dirty. Do not clean your drainage bag unless told by your  doctor. Emptying a drainage bag  Supplies Needed Rubbing alcohol. Gauze pad or cotton ball. Tape or a leg strap.  Steps Wash your hands with soap and water. Separate (detach) the bag from your leg. Hold the bag over the toilet or a clean container. Keep the bag below your hips and bladder. This stops pee (urine) from going back into the tube. Open the pour spout at the bottom of the bag. Empty the pee into the toilet or container. Do not let the pour spout touch any surface. Put rubbing alcohol on a gauze pad or cotton ball. Use the gauze pad or cotton ball to clean the pour spout. Close the pour spout. Attach the bag to your leg with tape or a leg strap. Wash your hands.  Changing a drainage bag Supplies Needed Alcohol wipes. A clean drainage bag. Adhesive tape or a leg strap.  Steps Wash your hands with soap and water. Separate the dirty bag from your leg. Pinch the rubber catheter with your fingers so that pee does not spill out. Separate the catheter tube from the drainage tube where these tubes connect (at the connection valve). Do not let the tubes touch any surface. Clean the end of the catheter tube with an alcohol wipe. Use a different alcohol wipe to clean the end of the drainage tube. Connect the catheter tube to the drainage tube of the clean bag. Attach the new bag to the leg with adhesive tape or a leg strap. Wash your hands.  How to prevent infection and other problems Never pull on your catheter or try to remove it. Pulling can damage tissue in your body. Always wash your hands before and after touching your catheter. If a leg strap gets wet, replace it with a dry one. Drink enough fluids to keep your pee clear or pale yellow, or as told by your doctor. Do not let the drainage bag or tubing touch the floor. Wear cotton underwear. If you are male, wipe from front to back after you poop (have a bowel movement). Check on the catheter often to make sure it  works and the tubing is not twisted. Get help if: Your pee is cloudy. Your pee smells unusually bad. Your pee is not draining into the bag. Your tube gets clogged. Your catheter starts to leak. Your bladder feels full. Get help right away if: You have redness, swelling, or pain where the catheter enters your body. You have fluid, pus, or a bad smell coming from the area where the catheter enters your body. The area where the catheter enters your body feels warm. You have a fever. You have pain in your: Stomach (abdomen). Legs. Lower back. Bladder. You see blood fill the catheter. Your pee is pink or red. You feel sick  to your stomach (nauseous). You throw up (vomit). You have chills. Your catheter gets pulled out. This information is not intended to replace advice given to you by your health care provider. Make sure you discuss any questions you have with your health care provider. Document Released: 11/24/2012 Document Revised: 06/27/2016 Document Reviewed: 01/12/2014 Elsevier Interactive Patient Education  2018 [employer].   Post Anesthesia Home Care Instructions  Activity: Get plenty of rest for the remainder of the day. A responsible individual must stay with you for 24 hours following the procedure.  For the next 24 hours, DO NOT: -Drive a car -Advertising copywriter -Drink alcoholic beverages -Take any medication unless instructed by your physician -Make any legal decisions or sign important papers.  Meals: Start with liquid foods such as gelatin or soup. Progress to regular foods as tolerated. Avoid greasy, spicy, heavy foods. If nausea and/or vomiting occur, drink only clear liquids until the nausea and/or vomiting subsides. Call your physician if vomiting continues.  Special Instructions/Symptoms: Your throat may feel dry or sore from the anesthesia or the breathing tube placed in your throat during surgery. If this causes discomfort, gargle with warm salt water.  The discomfort should disappear within 24 hours.  If you had a scopolamine patch placed behind your ear for the management of post- operative nausea and/or vomiting:  1. The medication in the patch is effective for 72 hours, after which it should be removed.  Wrap patch in a tissue and discard in the trash. Wash hands thoroughly with soap and water. 2. You may remove the patch earlier than 72 hours if you experience unpleasant side effects which may include dry mouth, dizziness or visual disturbances. 3. Avoid touching the patch. Wash your hands with soap and water after contact with the patch.

## 2018-04-29 ENCOUNTER — Encounter (HOSPITAL_BASED_OUTPATIENT_CLINIC_OR_DEPARTMENT_OTHER): Payer: Self-pay | Admitting: Urology

## 2019-04-04 IMAGING — CT CT RENAL STONE PROTOCOL
2 of 4 series · 15 of 46 positions shown, 17 images · non-contrast
Comparison: 05/09/2015 CT.

CLINICAL DATA: 36-year-old male with left flank pain yesterday.
Unable to urinate today. History kidney stones. Initial encounter.

EXAM:
CT ABDOMEN AND PELVIS WITHOUT CONTRAST
TECHNIQUE: Multidetector CT imaging of the abdomen and pelvis was performed
following the standard protocol without IV contrast.

[Series 3: renal stone 5.0 · axial · 0.90mm/px · z∈[+791,+1331]mm · 12 of 118 slices shown, 14 images]
[im 5/118  soft-tissue]
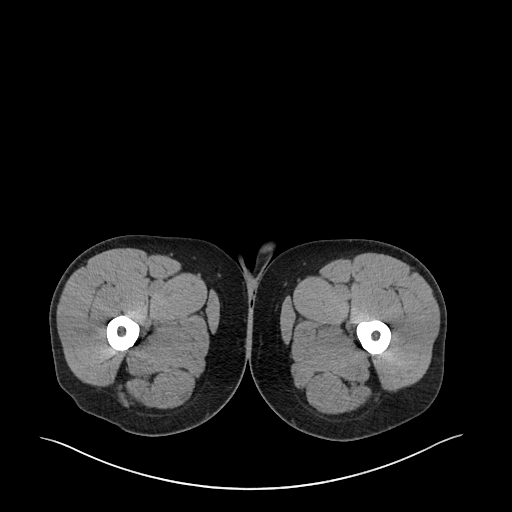
[im 5/118  bone]
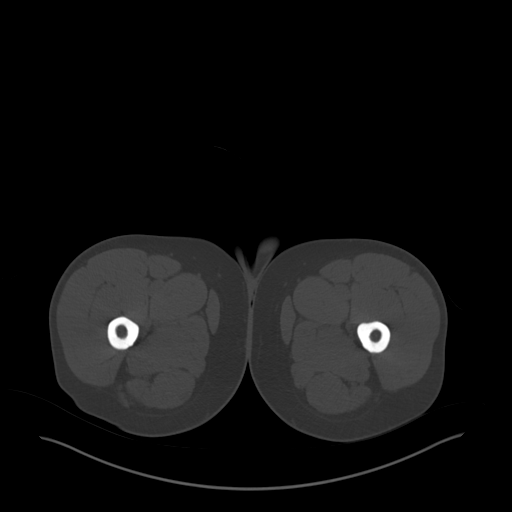
[im 15/118  soft-tissue]
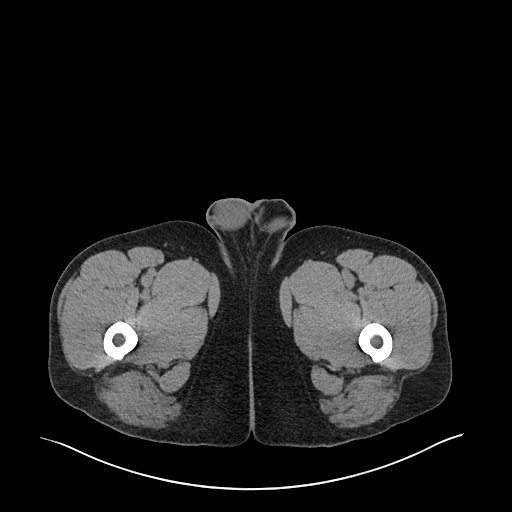
[im 25/118  soft-tissue]
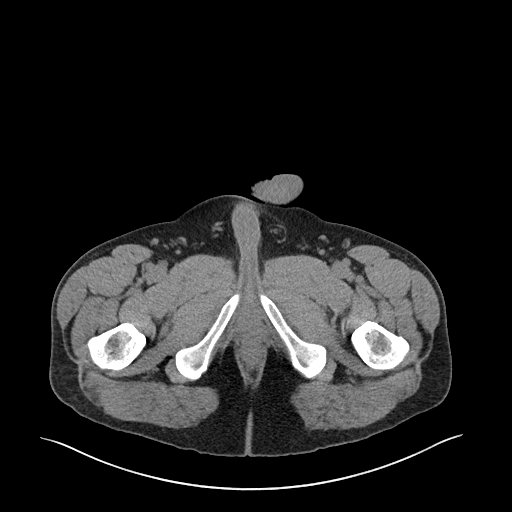
[im 35/118  soft-tissue]
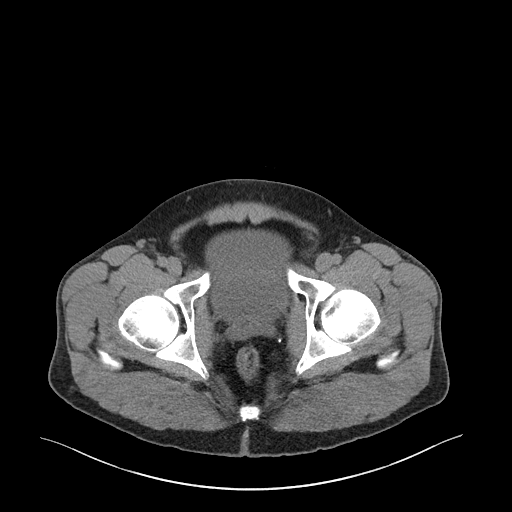
[im 44/118  soft-tissue]
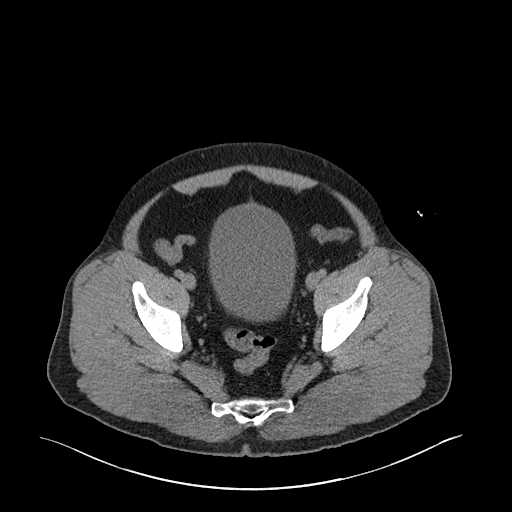
[im 54/118  soft-tissue]
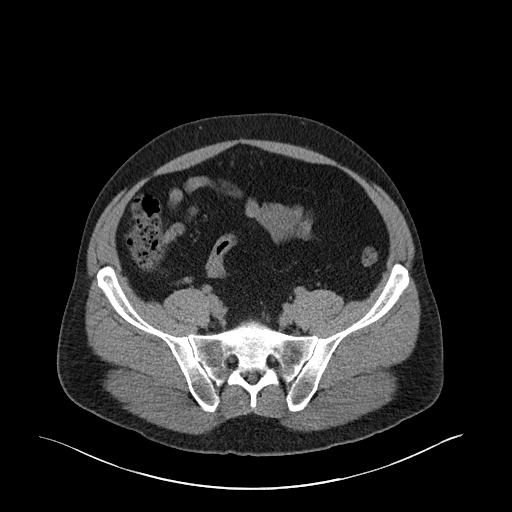
[im 64/118  soft-tissue]
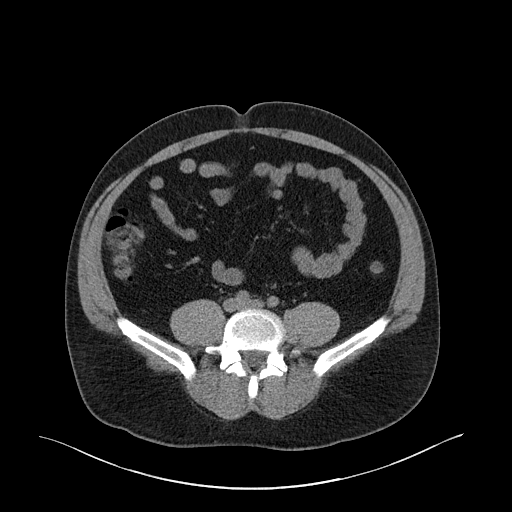
[im 74/118  soft-tissue]
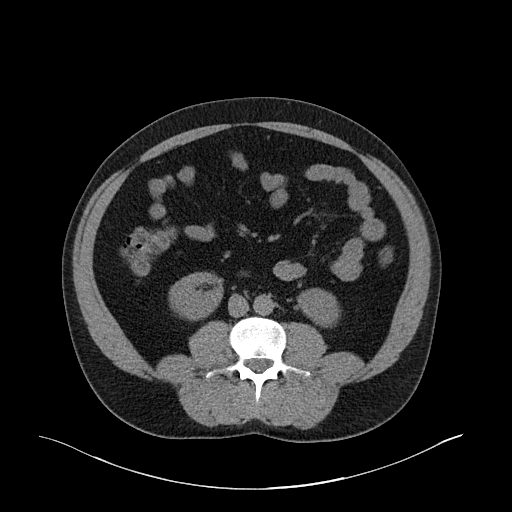
[im 83/118  soft-tissue]
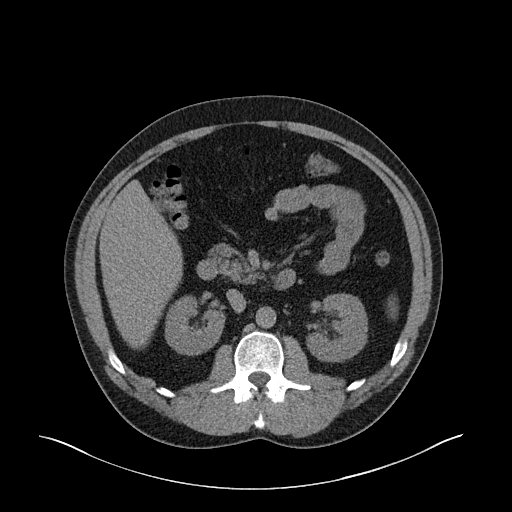
[im 83/118  bone]
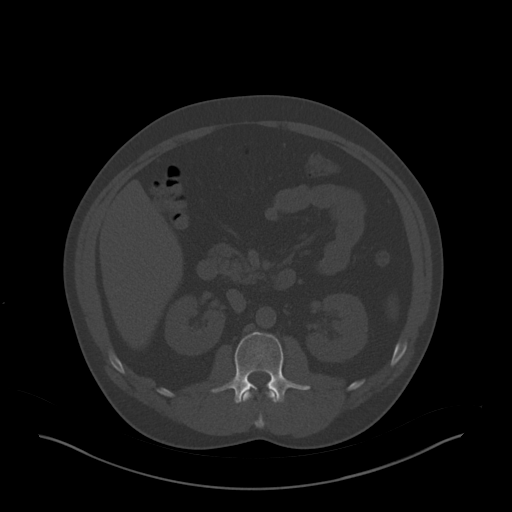
[im 93/118  soft-tissue]
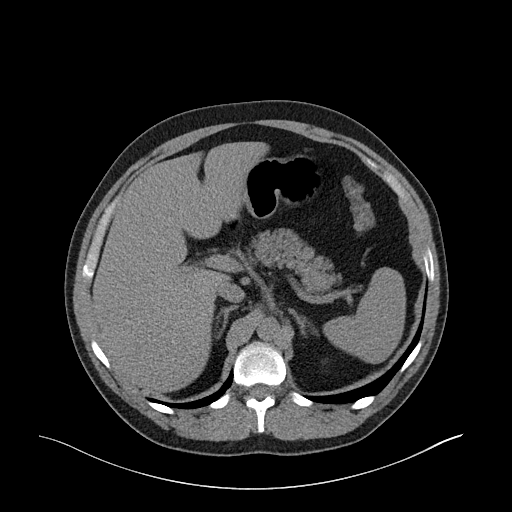
[im 103/118  soft-tissue]
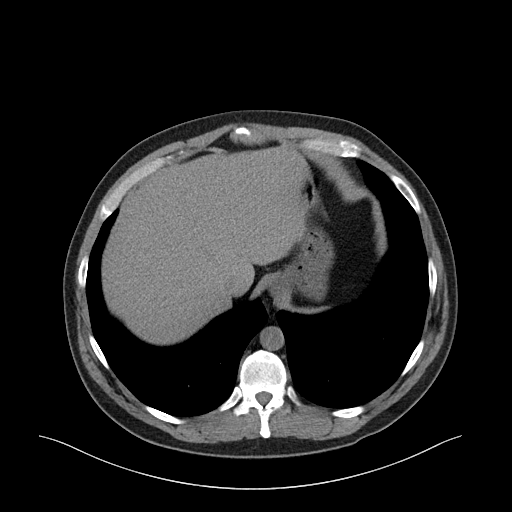
[im 113/118  soft-tissue]
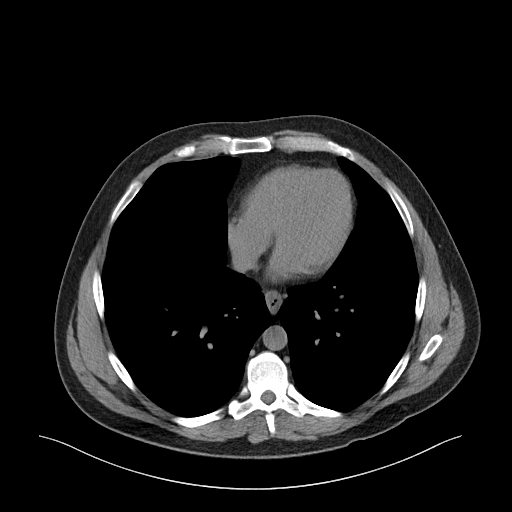

[Series 5: renal stone 3.0 cor · coronal · 0.75mm/px · 3 of 110 slices shown]
[im 37/110  soft-tissue]
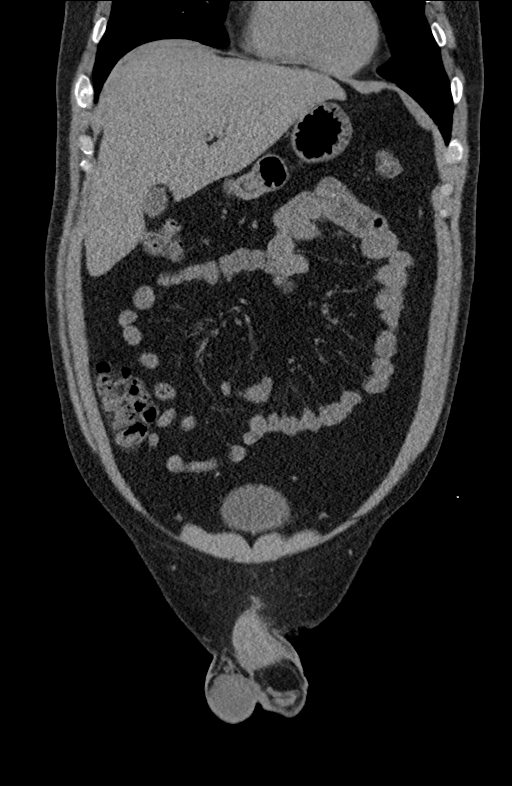
[im 49/110  soft-tissue]
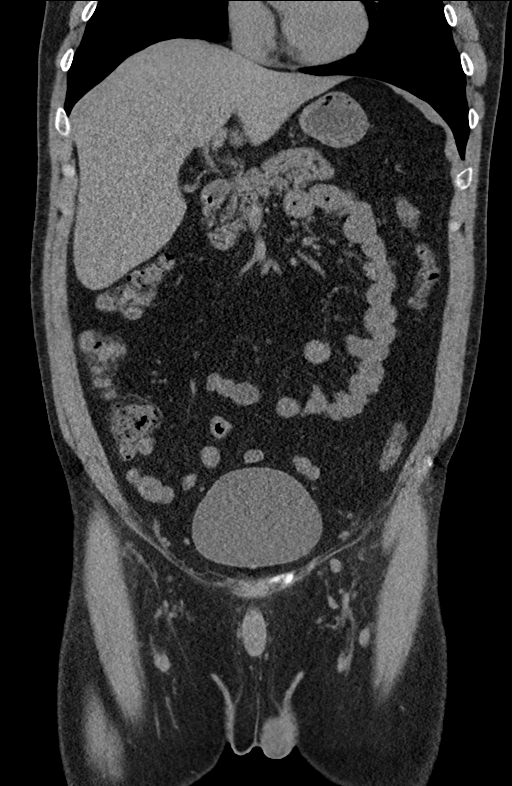
[im 61/110  soft-tissue]
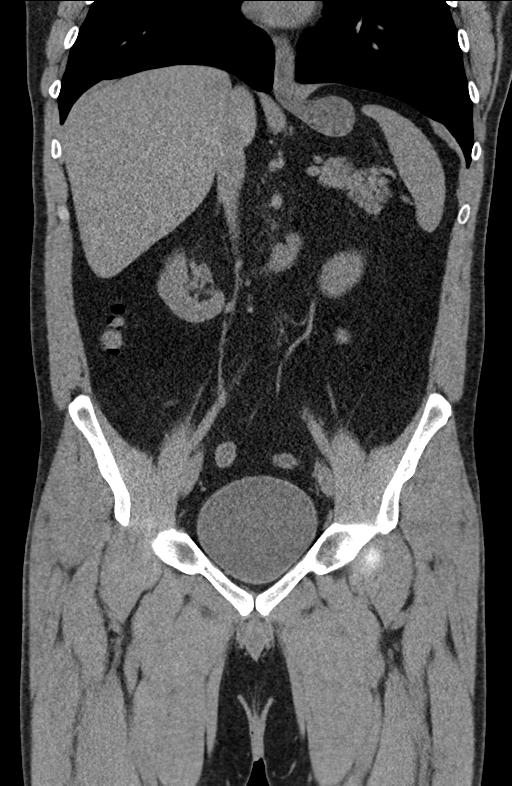

[15 of 46 positions shown; findings below may reference images not displayed]

FINDINGS: Lower chest: No worrisome lung base abnormality. Heart size within
normal limits.

Hepatobiliary: Enlarged slightly fatty infiltrated liver spanning
over 18.5 cm. Taking into account limitation by non contrast
imaging, no focal hepatic lesion.

Partially contracted gallbladder without calcified gallstone.

Pancreas: Taking into account limitation by non contrast imaging, no
worrisome pancreatic mass or inflammation.

Spleen: Taking into account limitation by non contrast imaging, no
splenic mass or enlargement.

Adrenals/Urinary Tract: No renal or ureteral obstructing stone or
hydronephrosis. Taking into account limitation by non contrast
imaging, no renal or adrenal worrisome lesion.

Noncontrast imaging of the urinary bladder unremarkable.

Stomach/Bowel: No extraluminal bowel inflammatory process. Under
distended distal esophagus limiting evaluation.

Vascular/Lymphatic: No aortic aneurysm.  No adenopathy.

Reproductive: Tiny prostatic gland calcification. No calcification
seen along urethra.

Other: No free air or bowel containing hernia.

Musculoskeletal: No worrisome osseous abnormality.
IMPRESSION: No renal or ureteral obstructing stone or hydronephrosis. No bladder
stone or urethral calculus noted.

Enlarged slightly fatty infiltrated liver spanning over 18.5 cm.

## 2019-08-22 DIAGNOSIS — Z8616 Personal history of COVID-19: Secondary | ICD-10-CM

## 2019-08-22 HISTORY — DX: Personal history of COVID-19: Z86.16

## 2020-05-19 ENCOUNTER — Emergency Department (HOSPITAL_COMMUNITY): Payer: BC Managed Care – PPO

## 2020-05-19 ENCOUNTER — Other Ambulatory Visit: Payer: Self-pay

## 2020-05-19 ENCOUNTER — Encounter (HOSPITAL_COMMUNITY): Payer: Self-pay | Admitting: Emergency Medicine

## 2020-05-19 ENCOUNTER — Emergency Department (HOSPITAL_COMMUNITY)
Admission: EM | Admit: 2020-05-19 | Discharge: 2020-05-19 | Disposition: A | Discharge status: Home / Self Care | Payer: BC Managed Care – PPO | Attending: Emergency Medicine | Admitting: Emergency Medicine

## 2020-05-19 DIAGNOSIS — R072 Precordial pain: Secondary | ICD-10-CM | POA: Insufficient documentation

## 2020-05-19 DIAGNOSIS — Z79899 Other long term (current) drug therapy: Secondary | ICD-10-CM | POA: Diagnosis not present

## 2020-05-19 DIAGNOSIS — K219 Gastro-esophageal reflux disease without esophagitis: Secondary | ICD-10-CM | POA: Insufficient documentation

## 2020-05-19 DIAGNOSIS — Z87891 Personal history of nicotine dependence: Secondary | ICD-10-CM | POA: Insufficient documentation

## 2020-05-19 DIAGNOSIS — I10 Essential (primary) hypertension: Secondary | ICD-10-CM | POA: Insufficient documentation

## 2020-05-19 DIAGNOSIS — R1013 Epigastric pain: Secondary | ICD-10-CM | POA: Insufficient documentation

## 2020-05-19 DIAGNOSIS — Z87898 Personal history of other specified conditions: Secondary | ICD-10-CM

## 2020-05-19 HISTORY — DX: Personal history of other specified conditions: Z87.898

## 2020-05-19 LAB — CBC
HCT: 43.1 % (ref 39.0–52.0)
Hemoglobin: 14.5 g/dL (ref 13.0–17.0)
MCH: 31 pg (ref 26.0–34.0)
MCHC: 33.6 g/dL (ref 30.0–36.0)
MCV: 92.3 fL (ref 80.0–100.0)
Platelets: 319 10*3/uL (ref 150–400)
RBC: 4.67 MIL/uL (ref 4.22–5.81)
RDW: 12.6 % (ref 11.5–15.5)
WBC: 8.1 10*3/uL (ref 4.0–10.5)
nRBC: 0 % (ref 0.0–0.2)

## 2020-05-19 LAB — BASIC METABOLIC PANEL
Anion gap: 15 (ref 5–15)
BUN: 13 mg/dL (ref 6–20)
CO2: 21 mmol/L — ABNORMAL LOW (ref 22–32)
Calcium: 9.4 mg/dL (ref 8.9–10.3)
Chloride: 101 mmol/L (ref 98–111)
Creatinine, Ser: 1.07 mg/dL (ref 0.61–1.24)
GFR calc non Af Amer: >60 mL/min (ref >60)
Glucose, Bld: 143 mg/dL — ABNORMAL HIGH (ref 70–99)
Potassium: 3.6 mmol/L (ref 3.5–5.1)
Sodium: 137 mmol/L (ref 135–145)

## 2020-05-19 LAB — HEPATIC FUNCTION PANEL
ALT: 30 U/L (ref 0–44)
AST: 28 U/L (ref 15–41)
Albumin: 4.7 g/dL (ref 3.5–5.0)
Alkaline Phosphatase: 54 U/L (ref 38–126)
Bilirubin, Direct: <0.1 mg/dL (ref 0.0–0.2)
Total Bilirubin: 0.9 mg/dL (ref 0.3–1.2)
Total Protein: 8.3 g/dL — ABNORMAL HIGH (ref 6.5–8.1)

## 2020-05-19 LAB — TROPONIN I (HIGH SENSITIVITY)
Troponin I (High Sensitivity): 3 ng/L (ref <18)
Troponin I (High Sensitivity): 3 ng/L (ref <18)

## 2020-05-19 LAB — LIPASE, BLOOD: Lipase: 42 U/L (ref 11–51)

## 2020-05-19 MED ORDER — LIDOCAINE VISCOUS HCL 2 % MT SOLN
15.0000 mL | Freq: Once | OROMUCOSAL | Status: AC
  Administered 2020-05-19: 15 mL via ORAL
  Filled 2020-05-19: qty 15

## 2020-05-19 MED ORDER — ALUM & MAG HYDROXIDE-SIMETH 200-200-20 MG/5ML PO SUSP
30.0000 mL | Freq: Once | ORAL | Status: AC
  Administered 2020-05-19: 30 mL via ORAL
  Filled 2020-05-19: qty 30

## 2020-05-19 MED ORDER — PANTOPRAZOLE SODIUM 20 MG PO TBEC: 20.0000 mg | DELAYED_RELEASE_TABLET | Freq: Every day | ORAL | 0 refills | Status: DC

## 2020-05-19 MED ORDER — HYDROXYZINE HCL 25 MG PO TABS: 25.0000 mg | ORAL_TABLET | Freq: Four times a day (QID) | ORAL | 0 refills | Status: AC

## 2020-05-19 MED ORDER — SUCRALFATE 1 G PO TABS: 1.0000 g | ORAL_TABLET | Freq: Three times a day (TID) | ORAL | 0 refills | Status: DC

## 2020-05-19 NOTE — ED Notes (Signed)
C/o burning sensation in his chest , states he also feels jittery all over, states he ate breakfast this morning

## 2020-05-19 NOTE — ED Notes (Signed)
Patient verbalizes understanding of discharge instructions. Opportunity for questioning and answers were provided. Armband removed by staff, pt discharged from ED Ambulatory.. ° °

## 2020-05-19 NOTE — ED Provider Notes (Signed)
[facility] EMERGENCY DEPARTMENT Provider Note   CSN: [id number] Arrival date & time: 05/19/20  9629     History Chief Complaint  Patient presents with  . Chest Pain    Hunter Black is a 39 y.o. male with past medical history significant for fatty liver, hypertension who presents for evaluation of chest pain.  Patient states yesterday he developed epigastric pain radiating to his substernal chest.  Did not radiate.  Occurred after he ate.  Does have history of reflux however states this was a "reflux never had.  Self resolved.  He woke up this morning and again had some substernal burning. He then felt like he had Bl hand tremors. CP resolved and tremors resolved on arrival to the ED. He did check in to UC whom told him to presents to the ED for further evaluation.  Patient states he does take Tums regularly.  Or intermittently take a PPI when he has reflux symptoms or does not take this daily.  He did take 1 yesterday.  States he does not get any exertional chest pain.  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  Does not get any chest pain with this.  No associated diaphoresis, nausea, vomiting.  Did not radiate into his back, left arm or left jaw.  No pleuritic chest pain pain lateral leg swelling, redness or warmth.  No prior history of PE or DVT.  No recent surgery, ablation or malignancy.  Denies fever, chills, nausea, vomiting, hemoptysis, shortness of breath, cough, abdominal pain, diarrhea, dysuria, lower extremity edema, erythema or warmth.  Denies additional aggravating or alleviating factors.  History obtained from patient and past medical records. No interpretor was used.  HPI     Past Medical History:  Diagnosis Date  . Carpal tunnel syndrome, right 08/2011  . Fatty liver   . GERD (gastroesophageal reflux disease)   . History of kidney stones 1-2 yrs. ago  . History of urinary retention 10/2017  . Hypertension   . Obese     Patient Active  Problem List   Diagnosis Date Noted  . Annual physical exam 05/23/2015  . Obesity 05/23/2015    Past Surgical History:  Procedure Laterality Date  . CYSTOSCOPY WITH RETROGRADE URETHROGRAM N/A 04/28/2018   Procedure: CYSTOSCOPY WITH RETROGRADE URETHROGRAM/ BALLOON DILATION;  Surgeon: Ihor Gully, MD;  Location: [facility];  Service: Urology;  Laterality: N/A;  ONLY NEEDS 30 MIN       Family History  Problem Relation Age of Onset  . Diabetes Paternal Grandmother     Social History   Tobacco Use  . Smoking status: Former Smoker    Types: Cigarettes  . Smokeless tobacco: Never Used  . Tobacco comment: quit about 10 years ago  Substance Use Topics  . Alcohol use: Yes    Alcohol/week: 10.0 - 15.0 standard drinks    Types: 10 - 15 Cans of beer per week    Comment: 2 x/week  . Drug use: No    Home Medications Prior to Admission medications   Medication Sig Start Date End Date Taking? Authorizing Provider  fexofenadine (ALLEGRA) 180 MG tablet Take 180 mg by mouth as needed for allergies or rhinitis.   Yes [provider]  ibuprofen (ADVIL) 200 MG tablet Take 600 mg by mouth every 6 (six) hours as needed for moderate pain.   Yes [provider]  phentermine 37.5 MG capsule Take 37.5 mg by mouth every morning.   Yes [provider]  HYDROcodone-acetaminophen (NORCO) 10-325 MG tablet Take 1-2 tablets by mouth every 4 (four) hours as needed for moderate pain. Maximum dose per 24 hours - 8 pills Patient not taking: Reported on 05/19/2020 04/28/18   Ihor Gullyttelin, Mark, MD  hydrOXYzine (ATARAX/VISTARIL) 25 MG tablet Take 1 tablet (25 mg total) by mouth every 6 (six) hours. 05/19/20   Liliana Dang A, PA-C  pantoprazole (PROTONIX) 20 MG tablet Take 1 tablet (20 mg total) by mouth daily. 05/19/20   Delani Kohli A, PA-C  phenazopyridine (PYRIDIUM) 200 MG tablet Take 1 tablet (200 mg total) by mouth 3 (three) times daily as needed for pain. Patient  not taking: Reported on 05/19/2020 04/28/18   Ihor Gullyttelin, Mark, MD  sucralfate (CARAFATE) 1 g tablet Take 1 tablet (1 g total) by mouth 4 (four) times daily -  with meals and at bedtime for 14 days. 05/19/20 06/02/20  Kyri Dai A, PA-C    Allergies    Patient has no known allergies.  Review of Systems   Review of Systems  Constitutional: Negative.   HENT: Negative.   Respiratory: Negative for apnea, cough, choking, chest tightness, shortness of breath, wheezing and stridor.   Cardiovascular: Positive for chest pain (Resolved).  Gastrointestinal: Positive for abdominal pain. Negative for abdominal distention, anal bleeding, blood in stool, constipation, diarrhea, nausea, rectal pain and vomiting.  Genitourinary: Negative.   Musculoskeletal: Negative.   Skin: Negative.   Neurological: Negative.   All other systems reviewed and are negative.   Physical Exam Updated Vital Signs BP (!) 153/90 (BP Location: Right Arm)   Pulse 88   Temp 97.7 F (36.5 C) (Oral)   Resp 16   Ht 5\' 10"  (1.778 m)   Wt 93 kg   SpO2 100%   BMI 29.41 kg/m   Physical Exam Vitals and nursing note reviewed.  Constitutional:      General: He is not in acute distress.    Appearance: He is well-developed. He is obese. He is not ill-appearing, toxic-appearing or diaphoretic.  HENT:     Head: Normocephalic and atraumatic.     Jaw: There is normal jaw occlusion.     Nose: Nose normal.  Eyes:     Extraocular Movements: Extraocular movements intact.     Pupils: Pupils are equal, round, and reactive to light.  Neck:     Vascular: No carotid bruit or JVD.     Trachea: Trachea and phonation normal.     Meningeal: Brudzinski's sign and Kernig's sign absent.  Cardiovascular:     Rate and Rhythm: Normal rate and regular rhythm.     Pulses: Normal pulses.          Radial pulses are 2+ on the right side and 2+ on the left side.       Posterior tibial pulses are 2+ on the right side and 2+ on the left side.      Heart sounds: Normal heart sounds.  Pulmonary:     Effort: Pulmonary effort is normal. No respiratory distress.     Breath sounds: Normal breath sounds and air entry.     Comments: Speaks in full sentences without difficulty. Lungs clear to auscultation.  Chest:     Chest wall: No deformity, swelling, tenderness, crepitus or edema.     Comments: Equal rise and fall to chest Abdominal:     General: Bowel sounds are normal. There is no distension or abdominal bruit.     Palpations: Abdomen is soft. There is  no fluid wave, hepatomegaly, splenomegaly or mass.     Tenderness: There is no abdominal tenderness. There is no guarding or rebound.  Musculoskeletal:        General: Normal range of motion.     Cervical back: Full passive range of motion without pain, normal range of motion and neck supple.     Right lower leg: No tenderness. No edema.     Left lower leg: No tenderness. No edema.     Comments: Compartments soft, No bony tenderness. Homans sing negative  Feet:     Right foot:     Skin integrity: Skin integrity normal.     Left foot:     Skin integrity: Skin integrity normal.  Skin:    General: Skin is warm and dry.     Capillary Refill: Capillary refill takes less than 2 seconds.     Comments: No edema, erythema, warmth.  Neurological:     General: No focal deficit present.     Mental Status: He is alert and oriented to person, place, and time.     Comments: CN 2-12 grossly intact. Ambulatory without difficulty     ED Results / Procedures / Treatments   Labs (all labs ordered are listed, but only abnormal results are displayed) Labs Reviewed  BASIC METABOLIC PANEL - Abnormal; Notable for the following components:      Result Value   CO2 21 (*)    Glucose, Bld 143 (*)    All other components within normal limits  HEPATIC FUNCTION PANEL - Abnormal; Notable for the following components:   Total Protein 8.3 (*)    All other components within normal limits  CBC  LIPASE,  BLOOD  TROPONIN I (HIGH SENSITIVITY)  TROPONIN I (HIGH SENSITIVITY)    EKG EKG Interpretation  Date/Time:  Thursday May 19 2020 09:40:33 EDT Ventricular Rate:  92 PR Interval:  140 QRS Duration: 88 QT Interval:  348 QTC Calculation: 430 R Axis:   82 Text Interpretation: Normal sinus rhythm with sinus arrhythmia T wave abnormality, consider anterolateral ischemia Abnormal ECG Confirmed by Margarita Grizzle [phone]) on 05/19/2020 1:32:42 PM   Radiology DG Chest 2 View  Result Date: 05/19/2020 CLINICAL DATA:  Chest pain EXAM: CHEST - 2 VIEW COMPARISON:  None. FINDINGS: The heart size and mediastinal contours are within normal limits. Both lungs are clear. No pleural effusion or pneumothorax. The visualized skeletal structures are unremarkable. IMPRESSION: No acute process in the chest. Electronically Signed   By: Guadlupe Spanish M.D.   On: 05/19/2020 10:05   Procedures Procedures (including critical care time)   Medications Ordered in ED Medications  alum & mag hydroxide-simeth (MAALOX/MYLANTA) 200-200-20 MG/5ML suspension 30 mL (30 mLs Oral Given 05/19/20 1117)    And  lidocaine (XYLOCAINE) 2 % viscous mouth solution 15 mL (15 mLs Oral Given 05/19/20 1117)   ED Course  I have reviewed the triage vital signs and the nursing notes.  Pertinent labs & imaging results that were available during my care of the patient were reviewed by me and considered in my medical decision making (see chart for details).  39 year old male presents for evaluation of CP. Afebrile, non septic non ill appearing. Occurred last night and again this morning. Non exertional, no pleuritic in nature. No radiation. Does a pretty exertional job and has not had CP.  No recent injury, trauma.  No unilateral leg swelling, redness or warmth.  No prior history of PE, DVT.  No recent malignancy,  surgery, immobilization.  He is without tachycardia, tachypnea or hypoxia.  No clinical evidence of DVT on exam. PERC negative,  well low risk. Abd soft, non tender without rebound or guarding.  Labs and imaging personally reviewed and interpreted:  CBC without leukocytosis Metabolic panel with mild hyperglycemia to 143 Hepatic function panel without abnormality Lipase 42 Trop 3>>3 Dg chest without acute infiltrates, cardiomegaly, pneumothorax, pulmonary edema. EKG without STEMI  Patient reassessed. Denies pain. Patient states feels improvement with GI cocktail in ED. Question reflux as cause of his symptoms. Will start on meds for reflux and anxiety as likely some component to this.  Patient is to be discharged with recommendation to follow up with PCP in regards to today's hospital visit. Chest pain is not likely of cardiac or pulmonary etiology d/t presentation, PERC negative, VSS, no tracheal deviation, no JVD or new murmur, RRR, breath sounds equal bilaterally, EKG without acute abnormalities, negative troponin, and negative CXR. Pt has been advised to return to the ED if CP becomes exertional, associated with diaphoresis or nausea, radiates to left jaw/arm, worsens or becomes concerning in any way. Pt appears reliable for follow up and is agreeable to discharge.   The patient has been appropriately medically screened and/or stabilized in the ED. I have low suspicion for any other emergent medical condition which would require further screening, evaluation or treatment in the ED or require inpatient management.  Patient is hemodynamically stable and in no acute distress.  Patient able to ambulate in department prior to ED.  Evaluation does not show acute pathology that would require ongoing or additional emergent interventions while in the emergency department or further inpatient treatment.  I have discussed the diagnosis with the patient and answered all questions.  Pain is been managed while in the emergency department and patient has no further complaints prior to discharge.  Patient is comfortable with plan  discussed in room and is stable for discharge at this time.  I have discussed strict return precautions for returning to the emergency department.  Patient was encouraged to follow-up with PCP/specialist refer to at discharge.    MDM Rules/Calculators/A&P                           Final Clinical Impression(s) / ED Diagnoses Final diagnoses:  Precordial pain  Gastroesophageal reflux disease without esophagitis    Rx / DC Orders ED Discharge Orders         Ordered    pantoprazole (PROTONIX) 20 MG tablet  Daily        05/19/20 1344    sucralfate (CARAFATE) 1 g tablet  3 times daily with meals & bedtime        05/19/20 1344    hydrOXYzine (ATARAX/VISTARIL) 25 MG tablet  Every 6 hours        05/19/20 1344           Abbygail Willhoite A, PA-C 05/19/20 1345    Margarita Grizzle, MD 05/20/20 1128

## 2020-05-19 NOTE — Discharge Instructions (Addendum)
Hydroxyzine for sleep and anxiety  Carafate for reflux-- Take with meals Protonix for reflux. Take once daily.  Return if you have any new or worsening symptoms such as exertional chest pain, coughing up blood, sweating, vomiting.

## 2020-05-19 NOTE — ED Triage Notes (Signed)
Pt arrives to ED with complaints of constant chest pain described as burning sensation. Pt states that this morning he felt "jittery" and decided to come in.

## 2020-06-29 ENCOUNTER — Other Ambulatory Visit: Payer: Self-pay | Admitting: Urology

## 2020-07-04 ENCOUNTER — Other Ambulatory Visit: Payer: Self-pay

## 2020-07-04 ENCOUNTER — Encounter (HOSPITAL_BASED_OUTPATIENT_CLINIC_OR_DEPARTMENT_OTHER): Payer: Self-pay | Admitting: Urology

## 2020-07-04 NOTE — Progress Notes (Signed)
Spoke w/ via phone for pre-op interview--- PT Lab needs dos----  no             Lab results------ current ekg in epic/ chart COVID test ------ 07-09-2020 @ 1105 Arrive at ------- 0745 NPO after MN NO Solid Food.  Clear liquids from MN until--- 0645 Medications to take morning of surgery ----- Protonix Diabetic medication ----- n/a Patient Special Instructions ----- n/a Pre-Op special Istructions ----- n/a Patient verbalized understanding of instructions that were given at this phone interview. Patient denies shortness of breath, chest pain, fever, cough at this phone interview.

## 2020-07-09 ENCOUNTER — Other Ambulatory Visit (HOSPITAL_COMMUNITY)
Admission: RE | Admit: 2020-07-09 | Discharge: 2020-07-09 | Disposition: A | Discharge status: Home / Self Care | Payer: BC Managed Care – PPO | Source: Ambulatory Visit | Attending: Urology | Admitting: Urology

## 2020-07-09 DIAGNOSIS — Z01812 Encounter for preprocedural laboratory examination: Secondary | ICD-10-CM | POA: Insufficient documentation

## 2020-07-09 DIAGNOSIS — Z20822 Contact with and (suspected) exposure to covid-19: Secondary | ICD-10-CM | POA: Insufficient documentation

## 2020-07-09 DIAGNOSIS — N35912 Unspecified bulbous urethral stricture, male: Secondary | ICD-10-CM | POA: Diagnosis not present

## 2020-07-09 DIAGNOSIS — Z87891 Personal history of nicotine dependence: Secondary | ICD-10-CM | POA: Diagnosis not present

## 2020-07-09 DIAGNOSIS — Z8719 Personal history of other diseases of the digestive system: Secondary | ICD-10-CM | POA: Diagnosis not present

## 2020-07-10 LAB — SARS CORONAVIRUS 2 (TAT 6-24 HRS): SARS Coronavirus 2: NEGATIVE

## 2020-07-11 NOTE — H&P (Signed)
have a urethral stricture.  HPI: Hunter Black is a 39 year-old male established patient who is here for a urethral stricture.   He does have a history of a urethral stricture. He does have to strain or bear down to start his urinary stream. He does not have a split stream when he urinates. He does not have a good size and strength to his urinary stream. He does have frequency. He is not having problems emptying his bladder. He has had the symptom(s) for 6 months.   He does not have a previous history of sexually transmitted diseases. He has had a history of trauma to the urethra . He has not received radiation therapy. His symptoms have gotten worse over the last year.   He has not previously had an indwelling catheter in for more than two weeks at a time.   08/27/18: He came in today reporting that he has developed some intermittent difficulties with urination requiring straining but he said it only occurs about 30% of the time. He said otherwise he voids with a good stream and has found that if he takes Rapaflo he does see some improvement but he sees the most improvement using Azo Standard. He also said that he occasionally sees what he thinks is a small piece of tissue about the size of a grain of rice.  06/23/20-patient with history of bulbous urethral stricture previously followed by Dr.Ottelin, required balloon dilatation under anesthesia back in September of 2019. Had recent cysto in January of 2020 showing some mild recurrence of the stricture but was able to pass the scope into the bladder. He presents today with complaint of several week history of decreased force of stream and feelings of incomplete emptying..  Micro urinalysis today is clear on urine spun sediment. Postvoid residual equals 163cc.  Cysto 2 performed today and shows: Concentric bulbous urethral stricture which I was able to pass the scope and gently dilate. Proximal to this however there is a more dense bulbous urethral  stricture and I did not attempt dilation as it was too narrow to pass the scope.     ALLERGIES: None   MEDICATIONS: Famotidine     GU PSH: Cysto Dilate Stricture (M or F) - 2019 Cystoscopy - 2020, 2019     NON-GU PSH: None   GU PMH: Urethral Stricture, Unspec - 2019 Bulbar urethral stricture - 2019, (Worsening), We discussed the fact that this appears to have progressed clinically. I offered dilation in the office versus the operating room but I think it would be best to perform this in the OR where I could perform a retrograde urethrogram and also balloon dilatation. We did discuss the risks of the procedure primarily being that of recurrence and the fact that this might need to be treated with some form of open surgical procedure in the future if it recurs., - 2019 Microscopic hematuria (Stable, Chronic) - 2019 Weak Urinary Stream (Worsening, Chronic), Will resume daily Silodosin 8 mg. Given Bladder Matters booklet and discussed fluid modifications - 2019 Anterior urethral stricture, He had a stricture that I could visualize the rest of the urethra through but it would not allow the flexible cystoscope passage. Rather than try and force it I did not elect to try and dilate the stricture. - 2019 Incomplete bladder emptying - 2019 Nocturia - 2019 Other urethritis (Improving) - 2019 Urinary Hesitancy - 2019 Gross hematuria, Gross hematuria - 2016 History of urolithiasis, History of renal calculi - 2016 Other microscopic hematuria,  Microscopic hematuria - 2016      PMH Notes: Bulbar urethral stricture: He underwent balloon dilatation on 04/28/18.     NON-GU PMH: Encounter for general adult medical examination without abnormal findings, Encounter for preventive health examination - 2016 Personal history of other diseases of the digestive system, History of esophageal reflux - 2016    FAMILY HISTORY: No pertinent family history - Runs In Family   SOCIAL HISTORY: Marital Status:  Single Preferred Language: English; Ethnicity: Not Hispanic Or Latino; Race: White Current Smoking Status: Patient does not smoke anymore. Has not smoked since 01/12/2008.   Tobacco Use Assessment Completed: Used Tobacco in last 30 days? Does not use smokeless tobacco. Drinks 2 drinks per day. Types of alcohol consumed: Liquor, Beer.  Does not use drugs. Drinks 4+ caffeinated drinks per day. Has not had a blood transfusion.    REVIEW OF SYSTEMS:    GU Review Male:   Patient denies frequent urination, hard to postpone urination, burning/ pain with urination, get up at night to urinate, leakage of urine, stream starts and stops, trouble starting your stream, have to strain to urinate , erection problems, and penile pain.  Gastrointestinal (Upper):   Patient denies nausea, vomiting, and indigestion/ heartburn.  Gastrointestinal (Lower):   Patient denies diarrhea and constipation.  Constitutional:   Patient denies fever, night sweats, weight loss, and fatigue.  Skin:   Patient denies skin rash/ lesion and itching.  Eyes:   Patient denies blurred vision and double vision.  Ears/ Nose/ Throat:   Patient denies sore throat and sinus problems.  Hematologic/Lymphatic:   Patient denies swollen glands and easy bruising.  Cardiovascular:   Patient denies leg swelling and chest pains.  Respiratory:   Patient denies cough and shortness of breath.  Endocrine:   Patient denies excessive thirst.  Musculoskeletal:   Patient denies back pain and joint pain.  Neurological:   Patient denies headaches and dizziness.  Psychologic:   Patient denies depression and anxiety.   VITAL SIGNS:      06/23/2020 08:17 AM  Weight 205 lb / 92.99 kg  Height 70 in / 177.8 cm  BP 128/85 mmHg  Heart Rate 76 /min  Temperature 98.2 F / 36.7 C  BMI 29.4 kg/m   GU PHYSICAL EXAMINATION:    Anus and Perineum: No hemorrhoids. No anal stenosis. No rectal fissure, no anal fissure. No edema, no dimple, no perineal tenderness, no  anal tenderness.  Scrotum: No lesions. No edema. No cysts. No warts.  Epididymides: Right: no spermatocele, no masses, no cysts, no tenderness, no induration, no enlargement. Left: no spermatocele, no masses, no cysts, no tenderness, no induration, no enlargement.  Testes: No tenderness, no swelling, no enlargement left testes. No tenderness, no swelling, no enlargement right testes. Normal location left testes. Normal location right testes. No mass, no cyst, no varicocele, no hydrocele left testes. No mass, no cyst, no varicocele, no hydrocele right testes.  Urethral Meatus: Normal size. No lesion, no wart, no discharge, no polyp. Normal location.  Penis: Circumcised, no warts, no cracks. No dorsal Peyronie's plaques, no left corporal Peyronie's plaques, no right corporal Peyronie's plaques, no scarring, no warts. No balanitis, no meatal stenosis.  Prostate: 40 gram or 2+ size. Left lobe normal consistency, right lobe normal consistency. Symmetrical lobes. No prostate nodule. Left lobe no tenderness, right lobe no tenderness.  Seminal Vesicles: Nonpalpable.  Sphincter Tone: Normal sphincter. No rectal tenderness. No rectal mass.    MULTI-SYSTEM PHYSICAL EXAMINATION:  Constitutional: Well-nourished. No physical deformities. Normally developed. Good grooming.  Neck: Neck symmetrical, not swollen. Normal tracheal position.  Respiratory: No labored breathing, no use of accessory muscles.   Cardiovascular: Normal temperature, normal extremity pulses, no swelling, no varicosities.  Lymphatic: No enlargement of neck, axillae, groin.  Skin: No paleness, no jaundice, no cyanosis. No lesion, no ulcer, no rash.  Neurologic / Psychiatric: Oriented to time, oriented to place, oriented to person. No depression, no anxiety, no agitation.  Gastrointestinal: No mass, no tenderness, no rigidity, non obese abdomen.  Eyes: Normal conjunctivae. Normal eyelids.  Ears, Nose, Mouth, and Throat: Left ear no scars, no  lesions, no masses. Right ear no scars, no lesions, no masses. Nose no scars, no lesions, no masses. Normal hearing. Normal lips.  Musculoskeletal: Normal gait and station of head and neck.     PAST DATA REVIEW: None   PROCEDURES:         Flexible Cystoscopy - 52000  Risks, benefits, and some of the potential complications of the procedure were discussed at length with the patient including infection, bleeding, voiding discomfort, urinary retention, fever, chills, sepsis, and others. All questions were answered. Informed consent was obtained. Antibiotic prophylaxis was given. Sterile technique and intraurethral analgesia were used.  Meatus:  Normal size. Normal location. Normal condition.  Urethra:  There is a concentric short bulbous urethral stricture which I was able to pass the scope through but proximally there is another stricture that was dense and could not pass the scope for dilate.      The lower urinary tract was carefully examined. The procedure was well-tolerated and without complications. Antibiotic instructions were given. Instructions were given to call the office immediately for bloody urine, difficulty urinating, urinary retention, painful or frequent urination, fever, chills, nausea, vomiting or other illness. The patient stated that he understood these instructions and would comply with them.        PVR Ultrasound - 77412  Scanned Volume: 163 cc         Urinalysis - 81003 Dipstick Dipstick Cont'd  Color: Yellow Bilirubin: Neg  Appearance: Clear Ketones: Neg  Specific Gravity: 1.025 Blood: Neg  pH: <=5.0 Protein: Neg  Glucose: Neg Urobilinogen: 0.2    Nitrites: Neg    Leukocyte Esterase: Neg    ASSESSMENT:      ICD-10 Details  1 NON-GU:   Unspecified bulbous urethral stricture, male - N35.912 Acute, Complicated Injury   PLAN:            Medications Stop Meds: Lisinopril-Hydrochlorothiazide 10 mg-12.5 mg tablet  Discontinue: 06/23/2020  - Reason: The  medication cycle was completed.            Document Letter(s):  Created for Patient: Clinical Summary         Notes:   I discussed cystoscopic findings with the patient. Recommended cysto and dilation or DVIU of the urethral stricture under anesthesia. Risks and benefits of the procedure were discussed in detail today. Told him there is a high likelihood he will need a Foley catheter for at least a day or 2 following the procedure to allow healing. Patient agreeable to proceed will schedule accordingly in the near future.

## 2020-07-11 NOTE — Anesthesia Preprocedure Evaluation (Addendum)
Anesthesia Evaluation  Patient identified by MRN, date of birth, ID band Patient awake    Reviewed: Allergy & Precautions, NPO status , Patient's Chart, lab work & pertinent test results  Airway Mallampati: II  TM Distance: >3 FB Neck ROM: Full    Dental no notable dental hx. (+) Teeth Intact, Dental Advisory Given   Pulmonary neg pulmonary ROS, former smoker,    Pulmonary exam normal breath sounds clear to auscultation       Cardiovascular hypertension, Pt. on medications and Pt. on home beta blockers Normal cardiovascular exam Rhythm:Regular Rate:Normal     Neuro/Psych  Neuromuscular disease negative psych ROS   GI/Hepatic Neg liver ROS, GERD  Medicated,  Endo/Other  negative endocrine ROS  Renal/GU negative Renal ROS  negative genitourinary   Musculoskeletal negative musculoskeletal ROS (+)   Abdominal   Peds  Hematology negative hematology ROS (+)   Anesthesia Other Findings   Reproductive/Obstetrics                            Anesthesia Physical Anesthesia Plan  ASA: II  Anesthesia Plan: General   Post-op Pain Management:    Induction: Intravenous  PONV Risk Score and Plan: 3 and Treatment may vary due to age or medical condition, Dexamethasone, Ondansetron and Midazolam  Airway Management Planned: LMA  Additional Equipment: None  Intra-op Plan:   Post-operative Plan:   Informed Consent: I have reviewed the patients History and Physical, chart, labs and discussed the procedure including the risks, benefits and alternatives for the proposed anesthesia with the patient or authorized representative who has indicated his/her understanding and acceptance.     Dental advisory given  Plan Discussed with: CRNA and Anesthesiologist  Anesthesia Plan Comments:        Anesthesia Quick Evaluation

## 2020-07-12 ENCOUNTER — Encounter (HOSPITAL_BASED_OUTPATIENT_CLINIC_OR_DEPARTMENT_OTHER)
Admission: RE | Disposition: A | Discharge status: Home / Self Care | Payer: Self-pay | Source: Home / Self Care | Attending: Urology

## 2020-07-12 ENCOUNTER — Encounter (HOSPITAL_BASED_OUTPATIENT_CLINIC_OR_DEPARTMENT_OTHER): Payer: Self-pay | Admitting: Urology

## 2020-07-12 ENCOUNTER — Other Ambulatory Visit (HOSPITAL_BASED_OUTPATIENT_CLINIC_OR_DEPARTMENT_OTHER): Payer: Self-pay | Admitting: Urology

## 2020-07-12 ENCOUNTER — Ambulatory Visit (HOSPITAL_BASED_OUTPATIENT_CLINIC_OR_DEPARTMENT_OTHER)
Admission: RE | Admit: 2020-07-12 | Discharge: 2020-07-12 | Disposition: A | Discharge status: Home / Self Care | Payer: BC Managed Care – PPO | Attending: Urology | Admitting: Urology

## 2020-07-12 ENCOUNTER — Ambulatory Visit (HOSPITAL_BASED_OUTPATIENT_CLINIC_OR_DEPARTMENT_OTHER): Payer: BC Managed Care – PPO | Admitting: Anesthesiology

## 2020-07-12 DIAGNOSIS — N35912 Unspecified bulbous urethral stricture, male: Secondary | ICD-10-CM | POA: Insufficient documentation

## 2020-07-12 DIAGNOSIS — Z20822 Contact with and (suspected) exposure to covid-19: Secondary | ICD-10-CM | POA: Insufficient documentation

## 2020-07-12 DIAGNOSIS — Z8719 Personal history of other diseases of the digestive system: Secondary | ICD-10-CM | POA: Insufficient documentation

## 2020-07-12 DIAGNOSIS — Z87891 Personal history of nicotine dependence: Secondary | ICD-10-CM | POA: Insufficient documentation

## 2020-07-12 HISTORY — PX: CYSTOSCOPY WITH URETHRAL DILATATION: SHX5125

## 2020-07-12 HISTORY — DX: Frequency of micturition: R35.0

## 2020-07-12 SURGERY — CYSTOSCOPY, WITH URETHRAL DILATION
Anesthesia: General

## 2020-07-12 MED ORDER — LIDOCAINE HCL URETHRAL/MUCOSAL 2 % EX GEL
CUTANEOUS | Status: DC | PRN
  Administered 2020-07-12: 1

## 2020-07-12 MED ORDER — OXYCODONE HCL 5 MG PO TABS: 5.0000 mg | ORAL_TABLET | Freq: Once | ORAL | Status: DC | PRN

## 2020-07-12 MED ORDER — MIDAZOLAM HCL 5 MG/5ML IJ SOLN
INTRAMUSCULAR | Status: DC | PRN
  Administered 2020-07-12: 2 mg via INTRAVENOUS

## 2020-07-12 MED ORDER — MIDAZOLAM HCL 2 MG/2ML IJ SOLN
INTRAMUSCULAR | Status: AC
  Filled 2020-07-12: qty 2

## 2020-07-12 MED ORDER — FENTANYL CITRATE (PF) 100 MCG/2ML IJ SOLN
INTRAMUSCULAR | Status: AC
  Filled 2020-07-12: qty 2

## 2020-07-12 MED ORDER — TRAMADOL HCL 50 MG PO TABS
50.0000 mg | ORAL_TABLET | Freq: Four times a day (QID) | ORAL | 0 refills | Status: DC | PRN
Start: 2020-07-12 — End: 2020-07-12

## 2020-07-12 MED ORDER — STERILE WATER FOR IRRIGATION IR SOLN
Status: DC | PRN
  Administered 2020-07-12: 3000 mL via INTRAVESICAL

## 2020-07-12 MED ORDER — HYDROMORPHONE HCL 1 MG/ML IJ SOLN: 0.2500 mg | INTRAMUSCULAR | Status: DC | PRN

## 2020-07-12 MED ORDER — ONDANSETRON HCL 4 MG/2ML IJ SOLN
INTRAMUSCULAR | Status: AC
  Filled 2020-07-12: qty 2

## 2020-07-12 MED ORDER — KETOROLAC TROMETHAMINE 30 MG/ML IJ SOLN
INTRAMUSCULAR | Status: AC
  Filled 2020-07-12: qty 1

## 2020-07-12 MED ORDER — ONDANSETRON HCL 4 MG/2ML IJ SOLN
INTRAMUSCULAR | Status: DC | PRN
  Administered 2020-07-12: 4 mg via INTRAVENOUS

## 2020-07-12 MED ORDER — KETOROLAC TROMETHAMINE 30 MG/ML IJ SOLN
INTRAMUSCULAR | Status: DC | PRN
  Administered 2020-07-12: 30 mg via INTRAVENOUS

## 2020-07-12 MED ORDER — SODIUM CHLORIDE 0.9 % IR SOLN
Status: DC | PRN
  Administered 2020-07-12: 3000 mL

## 2020-07-12 MED ORDER — FENTANYL CITRATE (PF) 100 MCG/2ML IJ SOLN
INTRAMUSCULAR | Status: DC | PRN
  Administered 2020-07-12: 50 ug via INTRAVENOUS
  Administered 2020-07-12: 25 ug via INTRAVENOUS
  Administered 2020-07-12: 50 ug via INTRAVENOUS

## 2020-07-12 MED ORDER — PROPOFOL 10 MG/ML IV BOLUS
INTRAVENOUS | Status: AC
  Filled 2020-07-12: qty 20

## 2020-07-12 MED ORDER — LIDOCAINE HCL (CARDIAC) PF 100 MG/5ML IV SOSY
PREFILLED_SYRINGE | INTRAVENOUS | Status: DC | PRN
  Administered 2020-07-12: 100 mg via INTRAVENOUS

## 2020-07-12 MED ORDER — OXYCODONE HCL 5 MG/5ML PO SOLN: 5.0000 mg | Freq: Once | ORAL | Status: DC | PRN

## 2020-07-12 MED ORDER — CEFAZOLIN SODIUM-DEXTROSE 2-4 GM/100ML-% IV SOLN
2.0000 g | INTRAVENOUS | Status: AC
  Administered 2020-07-12: 2 g via INTRAVENOUS

## 2020-07-12 MED ORDER — LIDOCAINE HCL (PF) 2 % IJ SOLN
INTRAMUSCULAR | Status: AC
  Filled 2020-07-12: qty 5

## 2020-07-12 MED ORDER — PROPOFOL 10 MG/ML IV BOLUS
INTRAVENOUS | Status: DC | PRN
  Administered 2020-07-12: 200 mg via INTRAVENOUS

## 2020-07-12 MED ORDER — LACTATED RINGERS IV SOLN: INTRAVENOUS | Status: DC

## 2020-07-12 MED ORDER — DEXAMETHASONE SODIUM PHOSPHATE 4 MG/ML IJ SOLN
INTRAMUSCULAR | Status: DC | PRN
  Administered 2020-07-12: 5 mg via INTRAVENOUS

## 2020-07-12 MED ORDER — CEFAZOLIN SODIUM-DEXTROSE 2-4 GM/100ML-% IV SOLN
INTRAVENOUS | Status: AC
  Filled 2020-07-12: qty 100

## 2020-07-12 MED ORDER — KETOROLAC TROMETHAMINE 30 MG/ML IJ SOLN: 30.0000 mg | Freq: Once | INTRAMUSCULAR | Status: DC | PRN

## 2020-07-12 MED ORDER — ONDANSETRON HCL 4 MG/2ML IJ SOLN: 4.0000 mg | Freq: Once | INTRAMUSCULAR | Status: DC | PRN

## 2020-07-12 MED FILL — traMADol HCL 50 MG TABS: 50 | 4 days supply | Qty: 15 | Fill #0

## 2020-07-12 SURGICAL SUPPLY — 36 items
BAG DRAIN URO-CYSTO SKYTR STRL (DRAIN) ×3 IMPLANT
BAG DRN RND TRDRP ANRFLXCHMBR (UROLOGICAL SUPPLIES) ×1
BAG DRN UROCATH (DRAIN) ×1
BAG URINE DRAIN 2000ML AR STRL (UROLOGICAL SUPPLIES) ×2 IMPLANT
BAG URINE LEG 500ML (DRAIN) ×2 IMPLANT
BALLN NEPHROSTOMY (BALLOONS)
BALLOON NEPHROSTOMY (BALLOONS) IMPLANT
CATH FOLEY 2W COUNCIL 20FR 5CC (CATHETERS) IMPLANT
CATH FOLEY 2W COUNCIL 5CC 18FR (CATHETERS) ×2 IMPLANT
CATH FOLEY 2WAY SLVR 30CC 20FR (CATHETERS) IMPLANT
CATH FOLEY 3WAY 30CC 22FR (CATHETERS) IMPLANT
CATH ROBINSON RED A/P 14FR (CATHETERS) ×3 IMPLANT
CATH URET 5FR 28IN CONE TIP (BALLOONS)
CATH URET 5FR 28IN OPEN ENDED (CATHETERS) IMPLANT
CATH URET 5FR 70CM CONE TIP (BALLOONS) IMPLANT
CLOTH BEACON ORANGE TIMEOUT ST (SAFETY) ×6 IMPLANT
DEVICE SECURE STRAP 25 ABSORB (INSTRUMENTS) ×2 IMPLANT
GLOVE BIO SURGEON STRL SZ7.5 (GLOVE) ×3 IMPLANT
GOWN STRL REUS W/ TWL XL LVL3 (GOWN DISPOSABLE) ×1 IMPLANT
GOWN STRL REUS W/TWL XL LVL3 (GOWN DISPOSABLE) ×6 IMPLANT
GUIDEWIRE ANG ZIPWIRE 038X150 (WIRE) IMPLANT
GUIDEWIRE STR DUAL SENSOR (WIRE) ×3 IMPLANT
IV NS IRRIG 3000ML ARTHROMATIC (IV SOLUTION) ×2 IMPLANT
KIT TURNOVER CYSTO (KITS) ×3 IMPLANT
MANIFOLD NEPTUNE II (INSTRUMENTS) ×3 IMPLANT
NS IRRIG 500ML POUR BTL (IV SOLUTION) IMPLANT
PACK CYSTO (CUSTOM PROCEDURE TRAY) ×3 IMPLANT
PLUG CATH AND CAP STER (CATHETERS) ×3 IMPLANT
SYR 20ML LL LF (SYRINGE) IMPLANT
SYR 30ML LL (SYRINGE) IMPLANT
SYR TOOMEY IRRIG 70ML (MISCELLANEOUS)
SYRINGE TOOMEY IRRIG 70ML (MISCELLANEOUS) IMPLANT
TUBE CONNECTING 12'X1/4 (SUCTIONS) ×1
TUBE CONNECTING 12X1/4 (SUCTIONS) ×2 IMPLANT
TUBING UROLOGY SET (TUBING) ×2 IMPLANT
WATER STERILE IRR 3000ML UROMA (IV SOLUTION) ×5 IMPLANT

## 2020-07-12 NOTE — Discharge Instructions (Signed)

## 2020-07-12 NOTE — Transfer of Care (Signed)
Immediate Anesthesia Transfer of Care Note  Patient: Hunter Black  Procedure(s) Performed: CYSTOSCOPY WITH URETHRAL DILATATION (N/A )  Patient Location: PACU  Anesthesia Type:General  Level of Consciousness: awake, alert  and oriented  Airway & Oxygen Therapy: Patient Spontanous Breathing and Patient connected to nasal cannula oxygen  Post-op Assessment: Report given to RN and Post -op Vital signs reviewed and stable  Post vital signs: Reviewed and stable  Last Vitals:  Vitals Value Taken Time  BP 143/93 07/12/20 1030  Temp    Pulse 64 07/12/20 1032  Resp 12 07/12/20 1032  SpO2 100 % 07/12/20 1032  Vitals shown include unvalidated device data.  Last Pain:  Vitals:   07/12/20 0813  TempSrc: Oral  PainSc: 0-No pain      Patients Stated Pain Goal: 4 (07/12/20 0813)  Complications: No complications documented.

## 2020-07-12 NOTE — Op Note (Signed)
Operative report  Preop diagnosis: Bulbous urethral stricture  Postop diagnosis: Bulbous urethral stricture 1.5 cm Procedure: Cystoscopy with dilation of bulbous urethral stricture, Foley insertion Surgeon: Benancio Deeds Anesthesia: General Estimated blood loss: Minimal Operative findings: Approximate 1.5 cm bulbous urethral stricture.  Dilated over guidewire with Geraldo Pitter sounds up to 26 [place].  Cystoscopy showed normal bladder.  18 [place] Glass blower/designer placed. Complications none Operative note: After obtaining informed consent for the patient was taken major cystoscopy suite placed under general anesthesia.  Placed in the dorsolithotomy position genitalia prepped and draped in usual sterile fashion.  21 Frans[place] was advanced into the urethra.  This was advanced down to the level of the bulbous urethral stricture.  At this point a sensor wire was passed under direct vision through the stricture and advanced into the bladder and confirmed coiled in the bladder under fluoroscopy.  I then utilized [employer] starting at [employer] dilating over the guidewire consecutively up to 26 [place].  The cystoscope was subsequently advanced over the guidewire was able to be easily passed into the bladder at this point.  Bladder appeared grossly normal.  Guidewire was removed and the cystoscope was backed out to assess the strictured area this was approximately 1.5 cm and widely patent.  Because of the length of the stricture felt Foley catheter would be prudent to allow healing.  Guidewire was passed under direct vision into the bladder and the cystoscope was removed.  An 8 [place] council Foley was subsequently passed over the guidewire and into the bladder.  There was clear urine obtained and balloon inflated up to 10 cc.  The procedure was terminated he was awakened from anesthesia and taken back to the recovery room in stable condition.  No immediate complication from the procedure.

## 2020-07-12 NOTE — Anesthesia Procedure Notes (Signed)
Procedure Name: LMA Insertion Date/Time: 07/12/2020 9:47 AM Performed by: Cleda Clarks, CRNA Pre-anesthesia Checklist: Patient identified, Emergency Drugs available, Suction available and Patient being monitored Patient Re-evaluated:Patient Re-evaluated prior to induction Oxygen Delivery Method: Circle system utilized Preoxygenation: Pre-oxygenation with 100% oxygen Induction Type: IV induction Ventilation: Mask ventilation without difficulty LMA: LMA inserted LMA Size: 5.0 Number of attempts: 1 Airway Equipment and Method: Bite block Placement Confirmation: positive ETCO2 Tube secured with: Tape Dental Injury: Teeth and Oropharynx as per pre-operative assessment

## 2020-07-12 NOTE — Anesthesia Postprocedure Evaluation (Signed)
Anesthesia Post Note  Patient: Hunter Black  Procedure(s) Performed: CYSTOSCOPY WITH URETHRAL DILATATION (N/A )     Patient location during evaluation: PACU Anesthesia Type: General Level of consciousness: awake and alert Pain management: pain level controlled Vital Signs Assessment: post-procedure vital signs reviewed and stable Respiratory status: spontaneous breathing, nonlabored ventilation, respiratory function stable and patient connected to nasal cannula oxygen Cardiovascular status: blood pressure returned to baseline and stable Postop Assessment: no apparent nausea or vomiting Anesthetic complications: no   No complications documented.  Last Vitals:  Vitals:   07/12/20 1030 07/12/20 1158  BP: (!) 143/93 125/76  Pulse: 73 60  Resp: 10 16  Temp: 36.4 C 36.7 C  SpO2: 98% 99%    Last Pain:  Vitals:   07/12/20 1158  TempSrc: Axillary  PainSc: 0-No pain                 Trevor Iha

## 2020-07-12 NOTE — Interval H&P Note (Signed)
History and Physical Interval Note:  07/12/2020 9:24 AM  Hunter Black  has presented today for surgery, with the diagnosis of BULBOUS URETHRAL STRICTURE.  The various methods of treatment have been discussed with the patient and family. After consideration of risks, benefits and other options for treatment, the patient has consented to  Procedure(s) with comments: CYSTOSCOPY WITH URETHRAL DILATATION (N/A) - 1 HR CYSTOSCOPY WITH DIRECT VISION INTERNAL URETHROTOMY (N/A) as a surgical intervention.  The patient's history has been reviewed, patient examined, no change in status, stable for surgery.  I have reviewed the patient's chart and labs.  Questions were answered to the patient's satisfaction.     [name]

## 2020-07-14 ENCOUNTER — Encounter (HOSPITAL_BASED_OUTPATIENT_CLINIC_OR_DEPARTMENT_OTHER): Payer: Self-pay | Admitting: Urology

## 2020-09-29 ENCOUNTER — Other Ambulatory Visit: Payer: Self-pay

## 2020-09-29 ENCOUNTER — Other Ambulatory Visit: Payer: Self-pay | Admitting: Nurse Practitioner

## 2020-09-29 ENCOUNTER — Ambulatory Visit
Admission: RE | Admit: 2020-09-29 | Discharge: 2020-09-29 | Disposition: A | Discharge status: Home / Self Care | Payer: BC Managed Care – PPO | Source: Ambulatory Visit | Attending: Nurse Practitioner | Admitting: Nurse Practitioner

## 2020-09-29 DIAGNOSIS — M25571 Pain in right ankle and joints of right foot: Secondary | ICD-10-CM

## 2022-06-25 ENCOUNTER — Other Ambulatory Visit: Payer: Self-pay | Admitting: Nurse Practitioner

## 2022-06-25 ENCOUNTER — Ambulatory Visit
Admission: RE | Admit: 2022-06-25 | Discharge: 2022-06-25 | Disposition: A | Discharge status: Home / Self Care | Payer: BC Managed Care – PPO | Source: Ambulatory Visit | Attending: Nurse Practitioner | Admitting: Nurse Practitioner

## 2022-06-25 DIAGNOSIS — M25522 Pain in left elbow: Secondary | ICD-10-CM

## 2022-09-25 ENCOUNTER — Encounter (HOSPITAL_BASED_OUTPATIENT_CLINIC_OR_DEPARTMENT_OTHER): Payer: Self-pay | Admitting: Urology

## 2022-09-25 NOTE — Progress Notes (Signed)
Spoke w/ via phone for pre-op interview---[facility] needs dos----    NONE           Lab results------ COVID test -----patient states asymptomatic no test needed Arrive at -------0815 NPO after MN NO Solid Food.  Clear liquids from MN until---0715 Med rec completed Medications to take morning of surgery -----NONE Diabetic medication ----- Patient instructed no nail polish to be worn day of surgery Patient instructed to bring photo id and insurance card day of surgery Patient aware to have Driver (ride ) / caregiver Chrystal Pembroke-wife   for 24 hours after surgery  Patient Special Instructions ----- Pre-Op special Istructions -----Hold Phentermine at lease 7 days prior to surgery, patient stated last dose was 09/21/22. Patient verbalized understanding of instructions that were given at this phone interview. Patient denies shortness of breath, chest pain, fever, cough at this phone interview.

## 2022-10-01 ENCOUNTER — Other Ambulatory Visit: Payer: Self-pay | Admitting: Urology

## 2022-10-01 NOTE — H&P (Signed)
I have a urethral stricture.  HPI: Hunter Black is a 42 year-old male established patient who is here for a urethral stricture.   He does have a history of a urethral stricture. He does have to strain or bear down to start his urinary stream. He does not have a split stream when he urinates. He does not have a good size and strength to his urinary stream. He does have frequency. He is not having problems emptying his bladder. He has had the symptom(s) for 6 months.   He does not have a previous history of sexually transmitted diseases. He has had a history of trauma to the urethra . He has not received radiation therapy. His symptoms have gotten worse over the last year.   He has not previously had an indwelling catheter in for more than two weeks at a time.   08/27/18: He came in today reporting that he has developed some intermittent difficulties with urination requiring straining but he said it only occurs about 30% of the time. He said otherwise he voids with a good stream and has found that if he takes Rapaflo he does see some improvement but he sees the most improvement using Azo Standard. He also said that he occasionally sees what he thinks is a small piece of tissue about the size of a grain of rice.  06/23/20-patient with history of bulbous urethral stricture previously followed by Dr.Ottelin, required balloon dilatation under anesthesia back in September of 2019. Had recent cysto in January of 2020 showing some mild recurrence of the stricture but was able to pass the scope into the bladder. He presents today with complaint of several week history of decreased force of stream and feelings of incomplete emptying..  Micro urinalysis today is clear on urine spun sediment. Postvoid residual equals 163cc.  Cysto 2 performed today and shows: Concentric bulbous urethral stricture which I was able to pass the scope and gently dilate. Proximal to this however there is a more dense bulbous urethral  stricture and I did not attempt dilation as it was too narrow to pass the scope.  -07/14/20-patient with bulbous urethral stricture underwent recent cysto and dilation of urethral stricture on 07/12/2020. Had significant 1/2 cm stricture and dilated with the urethral dilators. Foley catheter was placed. He has had no issues until overnight has a bowel movement and has had severe bladder spasms ever since and came today in hopes to taking the Foley catheter out. It is I told him that we significantly dilated this area and I had placed the Foley over guidewire to ensure proper placement. I felt uncomfortable taking it out at this time of the day for fear he would come back to the emergency room. I am going to see him back in the morning tomorrow for Foley repeat removal and voiding trial. I did give him some Gemtesa 75 mg dose today in the office to help with bladder spasms overnight.   08/27/2022: Pt with known hx of bulbous urethral stricture, last seen here in clinic in 07/2020. Today he states symptoms that he experienced with prior stricture recurrence are now beginning to happen. Just to a milder severity than before. He was hoping to get on top of this before it becomes worse. He is describing a weak and intermittent stream with some occasional splitting and spraying. Symptoms are not associated with any dysuria or gross hematuria. UA within normal limits today, PVR 27 mL.  -09/14/22-patient with history of bulbous urethral stricture status  post dilation on 07/12/2020. Patient has had recurrence of his prior urinary symptoms with decreased force of stream and is now here for follow-up cystoscopy to assess urethra.  Micro urinalysis is clear on urine spun sediment  Cystoscopy is performed today and shows recurrent bulbous urethral stricture at least a centimeter in size. Unable to pass the scope.     ALLERGIES: No Allergies    MEDICATIONS: No Medications    GU PSH: Cysto Dilate Stricture (M or F) -  07/12/2020, 2019 Cystoscopy - 06/23/2020, 2020, 2019     NON-GU PSH: No Non-GU PSH    GU PMH: Bulbar urethral stricture - 08/27/2022, - 07/15/2020, - 07/14/2020, - 2019 (Worsening), We discussed the fact that this appears to have progressed clinically. I offered dilation in the office versus the operating room but I think it would be best to perform this in the OR where I could perform a retrograde urethrogram and also balloon dilatation. We did discuss the risks of the procedure primarily being that of recurrence and the fact that this might need to be treated with some form of open surgical procedure in the future if it recurs., - 2019 Anterior urethral stricture - 07/15/2020, He had a stricture that I could visualize the rest of the urethra through but it would not allow the flexible cystoscope passage. Rather than try and force it I did not elect to try and dilate the stricture., - 2019 Urethral Stricture, Unspec - 2019 Microscopic hematuria (Stable, Chronic) - 2019 Weak Urinary Stream (Worsening, Chronic), Will resume daily Silodosin 8 mg. Given Bladder Matters booklet and discussed fluid modifications - 2019 Incomplete bladder emptying - 2019 Nocturia - 2019 Other urethritis (Improving) - 2019 Urinary Hesitancy - 2019 Gross hematuria, Gross hematuria - 2016 History of urolithiasis, History of renal calculi - 2016 Other microscopic hematuria, Microscopic hematuria - 2016      PMH Notes: Bulbar urethral stricture: He underwent balloon dilatation on 04/28/18.     NON-GU PMH: Unspecified bulbous urethral stricture, male - 06/23/2020 Encounter for general adult medical examination without abnormal findings, Encounter for preventive health examination - 2016 Personal history of other diseases of the digestive system, History of esophageal reflux - 2016    FAMILY HISTORY: No pertinent family history - Runs In Family   SOCIAL HISTORY: Marital Status: Single Preferred Language: English;  Ethnicity: Not Hispanic Or Latino; Race: White Current Smoking Status: Patient does not smoke anymore. Has not smoked since 01/12/2008.   Tobacco Use Assessment Completed: Used Tobacco in last 30 days? Does not use smokeless tobacco. Drinks 2 drinks per day. Types of alcohol consumed: Liquor, Beer.  Does not use drugs. Drinks 4+ caffeinated drinks per day. Has not had a blood transfusion.    REVIEW OF SYSTEMS:    GU Review Male:   Patient reports frequent urination. Patient denies burning/ pain with urination, hard to postpone urination, trouble starting your stream, penile pain, stream starts and stops, get up at night to urinate, erection problems, leakage of urine, and have to strain to urinate .  Gastrointestinal (Upper):   Patient denies nausea, vomiting, and indigestion/ heartburn.  Gastrointestinal (Lower):   Patient denies diarrhea and constipation.  Constitutional:   Patient denies fever, night sweats, weight loss, and fatigue.  Skin:   Patient denies skin rash/ lesion and itching.  Eyes:   Patient denies blurred vision and double vision.  Ears/ Nose/ Throat:   Patient denies sore throat and sinus problems.  Hematologic/Lymphatic:   Patient denies swollen  glands and easy bruising.  Cardiovascular:   Patient denies leg swelling and chest pains.  Respiratory:   Patient denies cough and shortness of breath.  Endocrine:   Patient denies excessive thirst.  Musculoskeletal:   Patient reports back pain. Patient denies joint pain.  Neurological:   Patient denies headaches and dizziness.  Psychologic:   Patient denies depression and anxiety.   VITAL SIGNS: None   GU PHYSICAL EXAMINATION:    Urethral Meatus: Normal size. No lesion, no wart, no discharge, no polyp. Normal location.  Penis: Circumcised, no warts, no cracks. No dorsal Peyronie's plaques, no left corporal Peyronie's plaques, no right corporal Peyronie's plaques, no scarring, no warts. No balanitis, no meatal stenosis.    MULTI-SYSTEM PHYSICAL EXAMINATION:    Constitutional: Well-nourished. No physical deformities. Normally developed. Good grooming.  Neck: Neck symmetrical, not swollen. Normal tracheal position.  Respiratory: No labored breathing, no use of accessory muscles.   Cardiovascular: Normal temperature, normal extremity pulses, no swelling, no varicosities.  Lymphatic: No enlargement of neck, axillae, groin.  Skin: No paleness, no jaundice, no cyanosis. No lesion, no ulcer, no rash.  Neurologic / Psychiatric: Oriented to time, oriented to place, oriented to person. No depression, no anxiety, no agitation.  Eyes: Normal conjunctivae. Normal eyelids.  Ears, Nose, Mouth, and Throat: Left ear no scars, no lesions, no masses. Right ear no scars, no lesions, no masses. Nose no scars, no lesions, no masses. Normal hearing. Normal lips.  Musculoskeletal: Normal gait and station of head and neck.     Complexity of Data:  Source Of History:  Patient  Records Review:   Previous Hospital Records  Urine Test Review:   Urinalysis   PROCEDURES:         Flexible Cystoscopy - 52000  Risks, benefits, and some of the potential complications of the procedure were discussed at length with the patient including infection, bleeding, voiding discomfort, urinary retention, fever, chills, sepsis, and others. All questions were answered. Informed consent was obtained. Antibiotic prophylaxis was given. Sterile technique and intraurethral analgesia were used.  Meatus:  Normal size. Normal location. Normal condition.  Urethra:  Moderate penile stricture. Moderate bulbous stricture.      The lower urinary tract was carefully examined. The procedure was well-tolerated and without complications. Antibiotic instructions were given. Instructions were given to call the office immediately for bloody urine, difficulty urinating, urinary retention, painful or frequent urination, fever, chills, nausea, vomiting or other illness. The  patient stated that he understood these instructions and would comply with them.         Urinalysis Dipstick Dipstick Cont'd  Color: Yellow Bilirubin: Neg mg/dL  Appearance: Clear Ketones: Neg mg/dL  Specific Gravity: 1.025 Blood: Neg ery/uL  pH: 6.5 Protein: Neg mg/dL  Glucose: Neg mg/dL Urobilinogen: 0.2 mg/dL    Nitrites: Neg    Leukocyte Esterase: Neg leu/uL    ASSESSMENT:      ICD-10 Details  1 GU:   Bulbar urethral stricture - 123XX123 Acute, Complicated Injury   PLAN:           Document Letter(s):  Created for Patient: Clinical Summary         Notes:   Discussed cystoscopy findings with the patient showing recurrent bulbous/anterior urethral stricture. I recommended Optilume dilation which would require Foley catheter for 48 hours afterwards. Risk and benefits of procedure were discussed in detail. Will schedule accordingly in the near future.

## 2022-10-02 ENCOUNTER — Ambulatory Visit (HOSPITAL_BASED_OUTPATIENT_CLINIC_OR_DEPARTMENT_OTHER): Payer: BC Managed Care – PPO | Admitting: Certified Registered Nurse Anesthetist

## 2022-10-02 ENCOUNTER — Ambulatory Visit (HOSPITAL_BASED_OUTPATIENT_CLINIC_OR_DEPARTMENT_OTHER)
Admission: RE | Admit: 2022-10-02 | Discharge: 2022-10-02 | Disposition: A | Discharge status: Home / Self Care | Payer: BC Managed Care – PPO | Attending: Urology | Admitting: Urology

## 2022-10-02 ENCOUNTER — Encounter (HOSPITAL_BASED_OUTPATIENT_CLINIC_OR_DEPARTMENT_OTHER)
Admission: RE | Disposition: A | Discharge status: Home / Self Care | Payer: Self-pay | Source: Home / Self Care | Attending: Urology

## 2022-10-02 ENCOUNTER — Ambulatory Visit (HOSPITAL_COMMUNITY): Payer: BC Managed Care – PPO

## 2022-10-02 ENCOUNTER — Encounter (HOSPITAL_BASED_OUTPATIENT_CLINIC_OR_DEPARTMENT_OTHER): Payer: Self-pay | Admitting: Urology

## 2022-10-02 ENCOUNTER — Other Ambulatory Visit (HOSPITAL_COMMUNITY): Payer: Self-pay

## 2022-10-02 DIAGNOSIS — K219 Gastro-esophageal reflux disease without esophagitis: Secondary | ICD-10-CM | POA: Insufficient documentation

## 2022-10-02 DIAGNOSIS — I10 Essential (primary) hypertension: Secondary | ICD-10-CM | POA: Diagnosis not present

## 2022-10-02 DIAGNOSIS — N35912 Unspecified bulbous urethral stricture, male: Secondary | ICD-10-CM | POA: Insufficient documentation

## 2022-10-02 DIAGNOSIS — G709 Myoneural disorder, unspecified: Secondary | ICD-10-CM | POA: Diagnosis not present

## 2022-10-02 DIAGNOSIS — Z87891 Personal history of nicotine dependence: Secondary | ICD-10-CM | POA: Diagnosis not present

## 2022-10-02 HISTORY — PX: CYSTOSCOPY WITH URETHRAL DILATATION: SHX5125

## 2022-10-02 SURGERY — CYSTOSCOPY, WITH URETHRAL DILATION
Anesthesia: General | Site: Urethra

## 2022-10-02 MED ORDER — CEFAZOLIN SODIUM 1 G IJ SOLR
INTRAMUSCULAR | Status: AC
  Filled 2022-10-02: qty 20

## 2022-10-02 MED ORDER — PROPOFOL 10 MG/ML IV BOLUS
INTRAVENOUS | Status: DC | PRN
  Administered 2022-10-02: 200 mg via INTRAVENOUS
  Administered 2022-10-02: 100 mg via INTRAVENOUS
  Administered 2022-10-02: 30 mg via INTRAVENOUS

## 2022-10-02 MED ORDER — KETOROLAC TROMETHAMINE 30 MG/ML IJ SOLN: 30.0000 mg | Freq: Once | INTRAMUSCULAR | Status: DC | PRN

## 2022-10-02 MED ORDER — OXYCODONE HCL 5 MG/5ML PO SOLN: 5.0000 mg | Freq: Once | ORAL | Status: DC | PRN

## 2022-10-02 MED ORDER — ONDANSETRON HCL 4 MG/2ML IJ SOLN
INTRAMUSCULAR | Status: AC
  Filled 2022-10-02: qty 2

## 2022-10-02 MED ORDER — ONDANSETRON HCL 4 MG/2ML IJ SOLN
INTRAMUSCULAR | Status: DC | PRN
  Administered 2022-10-02: 4 mg via INTRAVENOUS

## 2022-10-02 MED ORDER — AMISULPRIDE (ANTIEMETIC) 5 MG/2ML IV SOLN: 10.0000 mg | Freq: Once | INTRAVENOUS | Status: DC | PRN

## 2022-10-02 MED ORDER — DIPHENHYDRAMINE HCL 50 MG/ML IJ SOLN
INTRAMUSCULAR | Status: DC | PRN
  Administered 2022-10-02: 12.5 mg via INTRAVENOUS

## 2022-10-02 MED ORDER — STERILE WATER FOR IRRIGATION IR SOLN
Status: DC | PRN
  Administered 2022-10-02: 3000 mL

## 2022-10-02 MED ORDER — DIPHENHYDRAMINE HCL 50 MG/ML IJ SOLN
INTRAMUSCULAR | Status: AC
  Filled 2022-10-02: qty 1

## 2022-10-02 MED ORDER — FENTANYL CITRATE (PF) 100 MCG/2ML IJ SOLN
INTRAMUSCULAR | Status: AC
  Filled 2022-10-02: qty 2

## 2022-10-02 MED ORDER — MIDAZOLAM HCL 2 MG/2ML IJ SOLN
INTRAMUSCULAR | Status: AC
  Filled 2022-10-02: qty 2

## 2022-10-02 MED ORDER — IOHEXOL 300 MG/ML  SOLN
INTRAMUSCULAR | Status: DC | PRN
  Administered 2022-10-02: 10 mL

## 2022-10-02 MED ORDER — FENTANYL CITRATE (PF) 100 MCG/2ML IJ SOLN
INTRAMUSCULAR | Status: DC | PRN
  Administered 2022-10-02 (×2): 25 ug via INTRAVENOUS
  Administered 2022-10-02: 50 ug via INTRAVENOUS

## 2022-10-02 MED ORDER — LIDOCAINE HCL (PF) 2 % IJ SOLN
INTRAMUSCULAR | Status: AC
  Filled 2022-10-02: qty 5

## 2022-10-02 MED ORDER — ACETAMINOPHEN 500 MG PO TABS
1000.0000 mg | ORAL_TABLET | Freq: Once | ORAL | Status: AC
  Administered 2022-10-02: 1000 mg via ORAL

## 2022-10-02 MED ORDER — PROPOFOL 10 MG/ML IV BOLUS
INTRAVENOUS | Status: AC
  Filled 2022-10-02: qty 20

## 2022-10-02 MED ORDER — OXYCODONE HCL 5 MG PO TABS: 5.0000 mg | ORAL_TABLET | Freq: Once | ORAL | Status: DC | PRN

## 2022-10-02 MED ORDER — PROMETHAZINE HCL 25 MG/ML IJ SOLN: 6.2500 mg | INTRAMUSCULAR | Status: DC | PRN

## 2022-10-02 MED ORDER — DEXAMETHASONE SODIUM PHOSPHATE 10 MG/ML IJ SOLN
INTRAMUSCULAR | Status: DC | PRN
  Administered 2022-10-02: 10 mg via INTRAVENOUS

## 2022-10-02 MED ORDER — LACTATED RINGERS IV SOLN: INTRAVENOUS | Status: DC

## 2022-10-02 MED ORDER — DEXAMETHASONE SODIUM PHOSPHATE 10 MG/ML IJ SOLN
INTRAMUSCULAR | Status: AC
  Filled 2022-10-02: qty 1

## 2022-10-02 MED ORDER — STERILE WATER FOR IRRIGATION IR SOLN
Status: DC | PRN
  Administered 2022-10-02: 500 mL

## 2022-10-02 MED ORDER — ACETAMINOPHEN 500 MG PO TABS
ORAL_TABLET | ORAL | Status: AC
  Filled 2022-10-02: qty 2

## 2022-10-02 MED ORDER — OXYBUTYNIN CHLORIDE ER 10 MG PO TB24
10.0000 mg | ORAL_TABLET | Freq: Every day | ORAL | 0 refills | Status: AC
  Filled 2022-10-02: qty 7, 7d supply, fill #0

## 2022-10-02 MED ORDER — MIDAZOLAM HCL 5 MG/5ML IJ SOLN
INTRAMUSCULAR | Status: DC | PRN
  Administered 2022-10-02: 2 mg via INTRAVENOUS

## 2022-10-02 MED ORDER — FENTANYL CITRATE (PF) 100 MCG/2ML IJ SOLN: 25.0000 ug | INTRAMUSCULAR | Status: DC | PRN

## 2022-10-02 MED ORDER — LIDOCAINE 2% (20 MG/ML) 5 ML SYRINGE
INTRAMUSCULAR | Status: DC | PRN
  Administered 2022-10-02: 100 mg via INTRAVENOUS

## 2022-10-02 MED ORDER — CEFAZOLIN SODIUM-DEXTROSE 2-3 GM-%(50ML) IV SOLR
INTRAVENOUS | Status: DC | PRN
  Administered 2022-10-02: 2 g via INTRAVENOUS

## 2022-10-02 SURGICAL SUPPLY — 34 items
BAG DRAIN URO-CYSTO SKYTR STRL (DRAIN) ×2 IMPLANT
BAG DRN RND TRDRP ANRFLXCHMBR (UROLOGICAL SUPPLIES) ×1
BAG DRN UROCATH (DRAIN) ×1
BAG URINE DRAIN 2000ML AR STRL (UROLOGICAL SUPPLIES) IMPLANT
BALLN NEPHROSTOMY (BALLOONS)
BALLN OPTILUME DCB 30X3X75 (BALLOONS) ×1
BALLN OPTILUME DCB 30X5X75 (BALLOONS)
BALLOON NEPHROSTOMY (BALLOONS) IMPLANT
BALLOON OPTILUME DCB 30X3X75 (BALLOONS) IMPLANT
BALLOON OPTILUME DCB 30X5X75 (BALLOONS) IMPLANT
CATH FOLEY 2W COUNCIL 20FR 5CC (CATHETERS) IMPLANT
CATH FOLEY 2WAY SLVR  5CC 14FR (CATHETERS) ×1
CATH FOLEY 2WAY SLVR 5CC 14FR (CATHETERS) IMPLANT
CATH ROBINSON RED A/P 14FR (CATHETERS) ×2 IMPLANT
CATH URET 5FR 28IN CONE TIP (BALLOONS)
CATH URET 5FR 70CM CONE TIP (BALLOONS) IMPLANT
CATH URETL OPEN 5X70 (CATHETERS) IMPLANT
CLOTH BEACON ORANGE TIMEOUT ST (SAFETY) ×4 IMPLANT
GLOVE BIO SURGEON STRL SZ7.5 (GLOVE) ×2 IMPLANT
GOWN STRL REUS W/TWL LRG LVL3 (GOWN DISPOSABLE) ×2 IMPLANT
GUIDEWIRE ANG ZIPWIRE 038X150 (WIRE) IMPLANT
GUIDEWIRE STR DUAL SENSOR (WIRE) ×2 IMPLANT
HOLDER FOLEY CATH W/STRAP (MISCELLANEOUS) IMPLANT
KIT TURNOVER CYSTO (KITS) ×2 IMPLANT
MANIFOLD NEPTUNE II (INSTRUMENTS) IMPLANT
NS IRRIG 500ML POUR BTL (IV SOLUTION) IMPLANT
PACK CYSTO (CUSTOM PROCEDURE TRAY) ×2 IMPLANT
SLEEVE SCD COMPRESS KNEE MED (STOCKING) ×2 IMPLANT
SYR TOOMEY IRRIG 70ML (MISCELLANEOUS)
SYRINGE TOOMEY IRRIG 70ML (MISCELLANEOUS) IMPLANT
TUBE CONNECTING 12X1/4 (SUCTIONS) IMPLANT
WATER STERILE IRR 1000ML POUR (IV SOLUTION) IMPLANT
WATER STERILE IRR 3000ML UROMA (IV SOLUTION) ×2 IMPLANT
WATER STERILE IRR 500ML POUR (IV SOLUTION) IMPLANT

## 2022-10-02 NOTE — Anesthesia Postprocedure Evaluation (Signed)
Anesthesia Post Note  Patient: Hunter Black  Procedure(s) Performed: CYSTOSCOPY WITH  OPTILUME URETHRAL DILATATION (Urethra)     Patient location during evaluation: PACU Anesthesia Type: General Level of consciousness: awake Pain management: pain level controlled Vital Signs Assessment: post-procedure vital signs reviewed and stable Respiratory status: spontaneous breathing, nonlabored ventilation and respiratory function stable Cardiovascular status: blood pressure returned to baseline and stable Postop Assessment: no apparent nausea or vomiting Anesthetic complications: no   No notable events documented.  Last Vitals:  Vitals:   10/02/22 1100 10/02/22 1124  BP: 122/78 131/89  Pulse: 61 63  Resp: 15 16  Temp:  36.7 C  SpO2: 97% 100%    Last Pain:  Vitals:   10/02/22 1124  TempSrc:   PainSc: 0-No pain                 Ben Sanz P Ahron Hulbert

## 2022-10-02 NOTE — Op Note (Signed)
Preoperative diagnosis:  1.  Bulbar urethral stricture  Postoperative diagnosis: 1.  Same  Procedure(s): 1.  Optilume dilation of bulbar urethral stricture  Surgeon: Dr. Harold Barban  Anesthesia: General  Complications: None  EBL: Minimal  Specimens: None  Disposition of specimens: Not applicable  Intraoperative findings: Short less than 1/2 cm bulbar urethral stricture, was very concentric.  Dilated with Optilume 30 French balloon catheter to 10 atm pressure for 6 minutes and 14 French Foley was placed  Indication: Patient is a 42 year old white male with history of short bulbar urethral stricture which is required dilation in the past.  He send with current stricture presents the time of her Optilume dilation of urethral stricture.  Description of procedure:  After obtaining informed consent the patient was taken major cystoscopy suite placed under general anesthesia.  Placed in the dorsolithotomy position genitalia prepped neurological fashion.  Proper pause and timeout was performed.  The 18 French cystoscope was advanced into the urethra down to the level of the stricture.  Scope could not be passed into the short concentric stricture.  Patient has had prior cystoscopy in the past showing the stricture to be very short concentric.  A sensor wire was passed under direct vision through the stricture into the into the bladder under fluoroscopy.  The 7.5 c / 30 french balloon was then placed over the guidewire and manipulated through the strictured area centering the balloon of the stricture.  The insufflator was then insufflated with 10 atm pressure with initial wasp wasting which subsequently opened up promptly under fluoroscopy.  The balloon was Inflated for 6 minutes.  The balloon was then deflated and removed along with the guidewire.  59 French Foley catheter was then advanced into the bladder.  Cc placed in the Foley balloon.  Foley placed to gravity drain.  Procedure was  terminated he was awake from anesthesia taken back to recovery in stable condition.  No immediate complication from the procedure.

## 2022-10-02 NOTE — Anesthesia Preprocedure Evaluation (Addendum)
Anesthesia Evaluation  Patient identified by MRN, date of birth, ID band Patient awake    Reviewed: Allergy & Precautions, NPO status , Patient's Chart, lab work & pertinent test results  Airway Mallampati: I  TM Distance: >3 FB Neck ROM: Full    Dental no notable dental hx.    Pulmonary former smoker   Pulmonary exam normal        Cardiovascular hypertension, Normal cardiovascular exam     Neuro/Psych  Neuromuscular disease  negative psych ROS   GI/Hepatic Neg liver ROS,GERD  Controlled,,  Endo/Other  negative endocrine ROS    Renal/GU negative Renal ROS     Musculoskeletal negative musculoskeletal ROS (+)    Abdominal   Peds  Hematology negative hematology ROS (+)   Anesthesia Other Findings URETHRAL STRICTURE  Reproductive/Obstetrics                             Anesthesia Physical Anesthesia Plan  ASA: 2  Anesthesia Plan: General   Post-op Pain Management:    Induction: Intravenous  PONV Risk Score and Plan: 3 and Dexamethasone, Ondansetron, Midazolam and Treatment may vary due to age or medical condition  Airway Management Planned: LMA  Additional Equipment:   Intra-op Plan:   Post-operative Plan: Extubation in OR  Informed Consent: I have reviewed the patients History and Physical, chart, labs and discussed the procedure including the risks, benefits and alternatives for the proposed anesthesia with the patient or authorized representative who has indicated his/her understanding and acceptance.     Dental advisory given  Plan Discussed with: CRNA  Anesthesia Plan Comments:        Anesthesia Quick Evaluation

## 2022-10-02 NOTE — Anesthesia Procedure Notes (Signed)
Procedure Name: LMA Insertion Date/Time: 10/02/2022 9:44 AM  Performed by: Rogers Blocker, CRNAPre-anesthesia Checklist: Patient identified, Emergency Drugs available, Suction available and Patient being monitored Patient Re-evaluated:Patient Re-evaluated prior to induction Oxygen Delivery Method: Circle System Utilized Preoxygenation: Pre-oxygenation with 100% oxygen Induction Type: IV induction Ventilation: Mask ventilation without difficulty LMA: LMA inserted LMA Size: 5.0 Number of attempts: 1 Airway Equipment and Method: Bite block Placement Confirmation: positive ETCO2 Tube secured with: Tape Dental Injury: Teeth and Oropharynx as per pre-operative assessment

## 2022-10-02 NOTE — Interval H&P Note (Signed)
History and Physical Interval Note:  10/02/2022 9:29 AM  Hunter Black  has presented today for surgery, with the diagnosis of URETHRAL STRICTURE.  The various methods of treatment have been discussed with the patient and family. After consideration of risks, benefits and other options for treatment, the patient has consented to  Procedure(s) with comments: [place] (N/A) - 30 MINS as a surgical intervention.  The patient's history has been reviewed, patient examined, no change in status, stable for surgery.  I have reviewed the patient's chart and labs.  Questions were answered to the patient's satisfaction.     [name]

## 2022-10-02 NOTE — Discharge Instructions (Addendum)
  Post Anesthesia Home Care Instructions  Activity: Get plenty of rest for the remainder of the day. A responsible individual must stay with you for 24 hours following the procedure.  For the next 24 hours, DO NOT: -Drive a car -Paediatric nurse -Drink alcoholic beverages -Take any medication unless instructed by your physician -Make any legal decisions or sign important papers.  Meals: Start with liquid foods such as gelatin or soup. Progress to regular foods as tolerated. Avoid greasy, spicy, heavy foods. If nausea and/or vomiting occur, drink only clear liquids until the nausea and/or vomiting subsides. Call your physician if vomiting continues.  Special Instructions/Symptoms: Your throat may feel dry or sore from the anesthesia or the breathing tube placed in your throat during surgery. If this causes discomfort, gargle with warm salt water. The discomfort should disappear within 24 hours.  May take Tylenol as needed for soreness/discomfort beginning at 2:30 PM.

## 2022-10-02 NOTE — Transfer of Care (Signed)
Immediate Anesthesia Transfer of Care Note  Patient: Hunter Black  Procedure(s) Performed: CYSTOSCOPY WITH  OPTILUME URETHRAL DILATATION (Urethra)  Patient Location: PACU  Anesthesia Type:General  Level of Consciousness: drowsy, patient cooperative, and responds to stimulation  Airway & Oxygen Therapy: Patient Spontanous Breathing  Post-op Assessment: Report given to RN and Post -op Vital signs reviewed and stable  Post vital signs: Reviewed and stable  Last Vitals:  Vitals Value Taken Time  BP 132/87 10/02/22 1025  Temp 36.6 C 10/02/22 1025  Pulse 72 10/02/22 1026  Resp 6 10/02/22 1026  SpO2 92 % 10/02/22 1026  Vitals shown include unvalidated device data.  Last Pain:  Vitals:   10/02/22 0819  TempSrc: Oral  PainSc: 0-No pain      Patients Stated Pain Goal: 6 ([id number])  Complications: No notable events documented.

## 2022-10-03 ENCOUNTER — Encounter (HOSPITAL_BASED_OUTPATIENT_CLINIC_OR_DEPARTMENT_OTHER): Payer: Self-pay | Admitting: Urology

## 2022-10-09 ENCOUNTER — Other Ambulatory Visit: Payer: Self-pay

## 2022-10-09 ENCOUNTER — Emergency Department (HOSPITAL_COMMUNITY)
Admission: EM | Admit: 2022-10-09 | Discharge: 2022-10-09 | Disposition: A | Discharge status: Home / Self Care | Payer: BC Managed Care – PPO | Attending: Emergency Medicine | Admitting: Emergency Medicine

## 2022-10-09 DIAGNOSIS — R339 Retention of urine, unspecified: Secondary | ICD-10-CM | POA: Diagnosis present

## 2022-10-09 LAB — URINALYSIS, ROUTINE W REFLEX MICROSCOPIC
Bilirubin Urine: NEGATIVE
Glucose, UA: NEGATIVE mg/dL
Ketones, ur: NEGATIVE mg/dL
Leukocytes,Ua: NEGATIVE
Nitrite: POSITIVE — AB
Protein, ur: NEGATIVE mg/dL
Specific Gravity, Urine: 1.003 — ABNORMAL LOW (ref 1.005–1.030)
pH: 5 (ref 5.0–8.0)

## 2022-10-09 MED ORDER — CEPHALEXIN 500 MG PO CAPS
500.0000 mg | ORAL_CAPSULE | Freq: Once | ORAL | Status: AC
  Administered 2022-10-09: 500 mg via ORAL
  Filled 2022-10-09: qty 1

## 2022-10-09 MED ORDER — CEPHALEXIN 500 MG PO CAPS: 500.0000 mg | ORAL_CAPSULE | Freq: Three times a day (TID) | ORAL | 0 refills | Status: AC

## 2022-10-09 NOTE — ED Provider Notes (Signed)
[facility] Provider Note   CSN: [id number] Arrival date & time: 10/09/22  1801     History  Chief Complaint  Patient presents with   Urinary Retention    Hunter Black is a 42 y.o. male with a history of urethral stricture, underwent stricture dilation 1 week ago by urology, presented to ED with difficulty urinating.  Patient reports he woke up this morning and is having a hard time getting more than a few dribbles of urine out.  He is having pressure in his abdomen and his lower back.  He reports that he was urinating okay since he had the Foley removed, 4 days ago.  The Foley has been placed for 2 days duration after he had his urethral stricture dilation.  He says his pain is significant at this time.  He did took a pill this morning which was an Azo tablet as well as a "pill for bladder spasm".  He is not on antibiotics  HPI     Home Medications Prior to Admission medications   Medication Sig Start Date End Date Taking? Authorizing Provider  cephALEXin (KEFLEX) 500 MG capsule Take 1 capsule (500 mg total) by mouth 3 (three) times daily for 7 days. 10/09/22 10/16/22 Yes Jeovanni Heuring, Carola Rhine, MD  fexofenadine (ALLEGRA) 180 MG tablet Take 180 mg by mouth as needed for allergies or rhinitis.    [provider]  hydrOXYzine (ATARAX/VISTARIL) 25 MG tablet Take 1 tablet (25 mg total) by mouth every 6 (six) hours. 05/19/20   Henderly, Britni A, PA-C  ibuprofen (ADVIL) 200 MG tablet Take 600 mg by mouth every 6 (six) hours as needed for moderate pain.    [provider]  oxybutynin (DITROPAN XL) 10 MG 24 hr tablet Take 1 tablet (10 mg total) by mouth daily. 10/02/22   [name], MD  phentermine 37.5 MG capsule Take 37.5 mg by mouth every morning.    [provider]      Allergies    Patient has no known allergies.    Review of Systems   Review of Systems  Physical Exam Updated Vital Signs BP (!) 160/105 (BP  Location: Left Arm)   Pulse (!) 112   Temp 98.6 F (37 C) (Oral)   Resp 16   SpO2 100%  Physical Exam Constitutional:      General: He is not in acute distress. HENT:     Head: Normocephalic and atraumatic.  Eyes:     Conjunctiva/sclera: Conjunctivae normal.     Pupils: Pupils are equal, round, and reactive to light.  Cardiovascular:     Rate and Rhythm: Normal rate and regular rhythm.  Pulmonary:     Effort: Pulmonary effort is normal. No respiratory distress.  Genitourinary:    Comments: Able to urinate a small amount of dark orange urine Skin:    General: Skin is warm and dry.  Neurological:     General: No focal deficit present.     Mental Status: He is alert. Mental status is at baseline.  Psychiatric:        Mood and Affect: Mood normal.        Behavior: Behavior normal.     ED Results / Procedures / Treatments   Labs (all labs ordered are listed, but only abnormal results are displayed) Labs Reviewed  URINALYSIS, ROUTINE W REFLEX MICROSCOPIC - Abnormal; Notable for the following components:      Result Value  Color, Urine AMBER (*)    Specific Gravity, Urine 1.003 (*)    Hgb urine dipstick MODERATE (*)    Nitrite POSITIVE (*)    Bacteria, UA RARE (*)    All other components within normal limits  URINE CULTURE    EKG None  Radiology No results found.  Procedures Procedures    Medications Ordered in ED Medications  cephALEXin (KEFLEX) capsule 500 mg (has no administration in time range)    ED Course/ Medical Decision Making/ A&P                             Medical Decision Making Amount and/or Complexity of Data Reviewed Labs: ordered.  Risk Prescription drug management.   Patient is here with abdominal pain and discomfort that I suspect is related to bladder distention due to urinary outlet obstruction.  This may be due to urethral stricture or to bladder spasms.  I have requested nursing to replace his Foley, which per my review of  external records, was a size 16 [place] that was placed after his procedure.  We will check a UA for evidence of infection.  The Azo that he took may throw off urinalysis results.  I reviewed his external records as noted above including his urology office report.  The patient's wife is present at the bedside to provide supplemental history.  He was placed with complete resolution of the patient's discomfort.  UA per my review does have positive nitrites, no bacteria or white blood cell.  This may be contaminant but also reasonable to treat for potential infection given his recent instrumentation.  Will start him on Keflex 3 times daily and he will follow-up with urology this week, hopefully for again Foley removal and trial of voiding.  This possibly is also experiencing bladder spasms.  Less likely recurring stricture but this is something he would need to discuss with his urologist.        Final Clinical Impression(s) / ED Diagnoses Final diagnoses:  Urinary retention    Rx / DC Orders ED Discharge Orders          Ordered    cephALEXin (KEFLEX) 500 MG capsule  3 times daily        10/09/22 1956              Wyvonnia Dusky, MD 10/09/22 [phone]

## 2022-10-09 NOTE — ED Triage Notes (Signed)
Pt reports cystoscopy with urethral dilation on 2/20 with foley placement, foley removed on Thursday. Pt reports dribbling since waking this am.  Pt reports lower abd pain and lower back pain.

## 2022-10-11 LAB — URINE CULTURE: Culture: NO GROWTH

## 2024-07-28 ENCOUNTER — Ambulatory Visit: Admitting: Diagnostic Neuroimaging

## 2024-07-28 ENCOUNTER — Encounter: Payer: Self-pay | Admitting: Diagnostic Neuroimaging

## 2024-07-28 VITALS — BP 140/84 | HR 68 | Ht 70.0 in | Wt 199.4 lb

## 2024-07-28 DIAGNOSIS — G252 Other specified forms of tremor: Secondary | ICD-10-CM | POA: Diagnosis not present

## 2024-07-28 DIAGNOSIS — R29898 Other symptoms and signs involving the musculoskeletal system: Secondary | ICD-10-CM | POA: Diagnosis not present

## 2024-07-28 DIAGNOSIS — R258 Other abnormal involuntary movements: Secondary | ICD-10-CM

## 2024-07-28 MED ORDER — ALPRAZOLAM 0.5 MG PO TABS: ORAL_TABLET | ORAL | 0 refills | Status: AC

## 2024-07-28 NOTE — Patient Instructions (Signed)
°-   check MRI brain, cervical spine  - then consider DATscan  - then consider carbidopa / levodopa

## 2024-07-28 NOTE — Progress Notes (Signed)
 [facility]  PATIENT: Hunter Black DOB: Apr 09, 1981  REFERRING CLINICIAN: Barbra Odor, NP HISTORY FROM: patient  REASON FOR VISIT: new consult    HISTORICAL  CHIEF COMPLAINT:  Chief Complaint  Patient presents with   New Patient (Initial Visit)    Patient is in room 7  Patient is seeing provider today back pains, starting at the neck and upper right shoulder and lower left back, at the hip. Patient also states that the pain goes down his right arm which causes tremors.  Patient denies any falls within the past year.  Patient states that the pain gets worse with anxiety, throbbing pain in neck, pain is localized.     HISTORY OF PRESENT ILLNESS:   43 year old right-handed male here for evaluation of tremor and stiffness.  Symptoms started around 2024 with mild intermittent tremor of the right hand.  Sometimes this is notable when he is using tools or handwriting.  Also having some increasing pain in his neck, thoracic and lumbar region.  He feels slow on the right side.  His walking has become stiff.  He has some mild speech difficulty.  No headaches.  No family history of tremor.  No prodromal accidents injuries or traumas.  No change in smell or taste.  No acting out his dreams.     REVIEW OF SYSTEMS: Full 14 system review of systems performed and negative with exception of: as per HPI.  ALLERGIES: Allergies[1]  HOME MEDICATIONS: Outpatient Medications Prior to Visit  Medication Sig Dispense Refill   busPIRone (BUSPAR) 15 MG tablet Take 15 mg by mouth 2 (two) times daily.     fexofenadine (ALLEGRA) 180 MG tablet Take 180 mg by mouth as needed for allergies or rhinitis.     gabapentin (NEURONTIN) 100 MG capsule Take 100 mg by mouth 2 (two) times daily.     ibuprofen (ADVIL) 200 MG tablet Take 600 mg by mouth every 6 (six) hours as needed for moderate pain.     oxybutynin  (DITROPAN  XL) 10 MG 24 hr tablet Take 1 tablet (10 mg total) by mouth daily. 7  tablet 0   hydrOXYzine  (ATARAX /VISTARIL ) 25 MG tablet Take 1 tablet (25 mg total) by mouth every 6 (six) hours. (Patient not taking: Reported on 07/28/2024) 12 tablet 0   phentermine  37.5 MG capsule Take 37.5 mg by mouth every morning. (Patient not taking: Reported on 07/28/2024)     No facility-administered medications prior to visit.    PAST MEDICAL HISTORY: Past Medical History:  Diagnosis Date   Carpal tunnel syndrome, right    per pt intermittant   Fatty liver    Frequency of urination    GERD (gastroesophageal reflux disease)    History of 2019 novel coronavirus disease (COVID-19) 08/22/2019   positive results in care everywhere,  pt stated mild sympomts resolved   History of kidney stones    History of precordial chest pain 05/19/2020   ED visit in epic  (07-04-2020  per pt did not follow up with pcp as recommended since was told is was GERD,  denies any cardiac syptoms since, has been taking protonix  daily)   History of urinary retention 10/2017   Hypertension    07-04-2020 per pt last taken medication for HTN approx. 2019,  pt stated HTN improved and no longer needed medication and stated his pcp is aware    PAST SURGICAL HISTORY: Past Surgical History:  Procedure Laterality Date   CYSTOSCOPY WITH RETROGRADE URETHROGRAM N/A 04/28/2018  Procedure: CYSTOSCOPY WITH RETROGRADE URETHROGRAM/ BALLOON DILATION;  Surgeon: Ottelin, Mark, MD;  Location: [facility];  Service: Urology;  Laterality: N/A;  ONLY NEEDS 30 MIN   CYSTOSCOPY WITH URETHRAL DILATATION N/A 07/12/2020   Procedure: CYSTOSCOPY WITH URETHRAL DILATATION;  Surgeon: Rosalind Zachary NOVAK, MD;  Location: [facility];  Service: Urology;  Laterality: N/A;  1 HR   CYSTOSCOPY WITH URETHRAL DILATATION N/A 10/02/2022   Procedure: CYSTOSCOPY WITH  OPTILUME URETHRAL DILATATION;  Surgeon: Rosalind Zachary NOVAK, MD;  Location: [facility];  Service: Urology;  Laterality: N/A;  30 MINS     FAMILY HISTORY: Family History  Problem Relation Age of Onset   Diabetes Paternal Grandmother    Tremor Neg Hx     SOCIAL HISTORY: Social History   Socioeconomic History   Marital status: Married    Spouse name: Not on file   Number of children: Not on file   Years of education: Not on file   Highest education level: Not on file  Occupational History   Not on file  Tobacco Use   Smoking status: Former    Current packs/day: 0.00    Types: Cigarettes    Start date: 07/04/2002    Quit date: 07/04/2009    Years since quitting: 15.0   Smokeless tobacco: Never  Vaping Use   Vaping status: Never Used  Substance and Sexual Activity   Alcohol use: Yes    Alcohol/week: 28.0 standard drinks of alcohol    Types: 18 Cans of beer, 10 Standard drinks or equivalent per week    Comment: 12, 12 oz beers and 10 mixed drinks.   Drug use: No   Sexual activity: Yes    Birth control/protection: Condom  Other Topics Concern   Not on file  Social History Narrative   Patient does not live alone, lives with his wife.    Patient is currently employed full time.    Social Drivers of Health   Tobacco Use: Medium Risk (07/28/2024)   Patient History    Smoking Tobacco Use: Former    Smokeless Tobacco Use: Never    Passive Exposure: Not on Actuary Strain: Not on file  Food Insecurity: Not on file  Transportation Needs: Not on file  Physical Activity: Not on file  Stress: Not on file  Social Connections: Unknown (12/25/2021)   Received from [facility]   Social Network    Social Network: Not on file  Intimate Partner Violence: Unknown (11/16/2021)   Received from [facility]   HITS    Physically Hurt: Not on file    Insult or Talk Down To: Not on file    Threaten Physical Harm: Not on file    Scream or Curse: Not on file  Depression (PHQ2-9): Not on file  Alcohol Screen: Not on file  Housing: Not on file  Utilities: Not on file  Health Literacy: Not on file      PHYSICAL EXAM  GENERAL EXAM/CONSTITUTIONAL: Vitals:  Vitals:   07/28/24 0955  BP: (!) 140/84  Pulse: 68  Weight: 199 lb 6.4 oz (90.4 kg)  Height: 5' 10 (1.778 m)   Body mass index is 28.61 kg/m. Wt Readings from Last 3 Encounters:  07/28/24 199 lb 6.4 oz (90.4 kg)  10/02/22 212 lb 1.6 oz (96.2 kg)  07/12/20 211 lb 3.2 oz (95.8 kg)   Patient is in no distress; well developed, nourished and groomed; neck is supple  CARDIOVASCULAR: Examination of  carotid arteries is normal; no carotid bruits Regular rate and rhythm, no murmurs Examination of peripheral vascular system by observation and palpation is normal  EYES: Ophthalmoscopic exam of optic discs and posterior segments is normal; no papilledema or hemorrhages No results found.  MUSCULOSKELETAL: Gait, strength, tone, movements noted in Neurologic exam below  NEUROLOGIC: MENTAL STATUS:      No data to display         awake, alert, oriented to person, place and time recent and remote memory intact normal attention and concentration language fluent, comprehension intact, naming intact fund of knowledge appropriate  CRANIAL NERVE:  2nd - no papilledema on fundoscopic exam 2nd, 3rd, 4th, 6th - pupils equal and reactive to light, visual fields full to confrontation, extraocular muscles intact, no nystagmus 5th - facial sensation symmetric 7th - facial strength symmetric 8th - hearing intact 9th - palate elevates symmetrically, uvula midline 11th - shoulder shrug symmetric 12th - tongue protrusion midline MASKED FACIES, INTERMITTENT HEAD / CHIN TREMOR  MOTOR:  INCREASED TONE IN R > L UPPER EXT; COGWHEELING RIGIDITY BRADYKINESIA IN RUE > LUE INTERMITTENT RESTING AND POSTURAL TREMOR IN RUE normal bulk, full strength in the BUE, BLE  SENSORY:  normal and symmetric to light touch, temperature, vibration  COORDINATION:  finger-nose-finger, fine finger movements SLOW ON RIGHT   REFLEXES:  deep tendon  reflexes 1+ and symmetric POSITIVE PALMOMENTAL REFLEX ON LEFT; NEG SNOUT; NEG MYERSONS  GAIT/STATION:  narrow based gait; DECR ARM SWING (RIGHT SLOWER THAN LEFT); ABLE TO WALK TOE, HEEL AND TANDEM; ROMBERG NEGATIVE     DIAGNOSTIC DATA (LABS, IMAGING, TESTING) - I reviewed patient records, labs, notes, testing and imaging myself where available.  Lab Results  Component Value Date   WBC 8.1 05/19/2020   HGB 14.5 05/19/2020   HCT 43.1 05/19/2020   MCV 92.3 05/19/2020   PLT 319 05/19/2020      Component Value Date/Time   NA 137 05/19/2020 0953   K 3.6 05/19/2020 0953   CL 101 05/19/2020 0953   CO2 21 (L) 05/19/2020 0953   GLUCOSE 143 (H) 05/19/2020 0953   BUN 13 05/19/2020 0953   CREATININE 1.07 05/19/2020 0953   CREATININE 0.99 06/18/2013 1347   CALCIUM 9.4 05/19/2020 0953   PROT 8.3 (H) 05/19/2020 1044   ALBUMIN 4.7 05/19/2020 1044   AST 28 05/19/2020 1044   ALT 30 05/19/2020 1044   ALKPHOS 54 05/19/2020 1044   BILITOT 0.9 05/19/2020 1044   GFRNONAA >60 05/19/2020 0953   GFRAA >60 10/29/2017 1445   No results found for: CHOL, HDL, LDLCALC, LDLDIRECT, TRIG, CHOLHDL No results found for: YHAJ8R No results found for: VITAMINB12 No results found for: TSH    ASSESSMENT AND PLAN  43 y.o. year old right handed male here with:  Dx:  1. Resting tremor   2. Bradykinesia   3. Cogwheel rigidity     PLAN:  RESTING TREMOR, BRADYKINESIA, COGWHEELING RIGIDITY (since ~Nov 2024; gradual onset, progressive; ddx: parkinsonism, vascular, autoimmune, inflamm, compressive myelopathy) - check MRI brain, cervical spine - then consider DATscan - then consider carbidopa / levodopa  Orders Placed This Encounter  Procedures   MR BRAIN W WO CONTRAST   MR CERVICAL SPINE WO CONTRAST   Meds ordered this encounter  Medications   ALPRAZolam  (XANAX ) 0.5 MG tablet    Sig: for sedation before MRI scan; take 1 tab 1 hour before scan; may repeat 1 tab 15 min before  scan    Dispense:  3  tablet    Refill:  0   Return in about 4 months (around 11/26/2024).  I reviewed labs, notes, records myself. 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     EDUARD FABIENE HANLON, MD 07/28/2024, 10:51 AM Certified in Neurology, Neurophysiology and Neuroimaging  [facility] [address], [address] [place], [place] [address] [phone]     [1] No Known Allergies

## 2024-07-29 ENCOUNTER — Other Ambulatory Visit

## 2024-07-29 DIAGNOSIS — R29898 Other symptoms and signs involving the musculoskeletal system: Secondary | ICD-10-CM | POA: Diagnosis not present

## 2024-07-29 DIAGNOSIS — R258 Other abnormal involuntary movements: Secondary | ICD-10-CM

## 2024-07-29 DIAGNOSIS — G252 Other specified forms of tremor: Secondary | ICD-10-CM

## 2024-07-29 MED ORDER — GADOBENATE DIMEGLUMINE 529 MG/ML IV SOLN
20.0000 mL | Freq: Once | INTRAVENOUS | Status: AC | PRN
  Administered 2024-07-29: 10:00:00 20 mL via INTRAVENOUS

## 2024-08-03 ENCOUNTER — Ambulatory Visit: Payer: Self-pay | Admitting: Diagnostic Neuroimaging

## 2024-08-03 DIAGNOSIS — G252 Other specified forms of tremor: Secondary | ICD-10-CM

## 2024-08-03 DIAGNOSIS — R29898 Other symptoms and signs involving the musculoskeletal system: Secondary | ICD-10-CM

## 2024-08-03 DIAGNOSIS — R258 Other abnormal involuntary movements: Secondary | ICD-10-CM

## 2024-08-04 NOTE — Telephone Encounter (Signed)
 Pt called to request MD or Nurse  give him a call to go over Results with PT

## 2024-08-05 NOTE — Telephone Encounter (Signed)
 Please advise if I can schedule a video visit for the patient to discuss results patient has more questions he would like to address.

## 2024-08-10 NOTE — Telephone Encounter (Signed)
 Pt called to follow up about MRI results the patient wants to know what next steps will be

## 2024-08-17 NOTE — Addendum Note (Signed)
 Addended by: MARGARET CARNE R on: 08/17/2024 04:40 PM   Modules accepted: Orders

## 2024-08-18 NOTE — Telephone Encounter (Signed)
 Spoke to pt he will call and see about getting scheduled. Will need xanax  for scan.  He has not been on the phentermine  for over 2 wks.  All other meds not on his med list.  He will mychart when scheduled.  Appreciated call.

## 2024-08-19 ENCOUNTER — Telehealth: Payer: Self-pay | Admitting: Diagnostic Neuroimaging

## 2024-08-19 NOTE — Telephone Encounter (Signed)
 [employer] shara: [phone] exp. 08/19/24-11/16/24 scheduled at [facility] on 1/21.

## 2024-09-02 ENCOUNTER — Encounter (HOSPITAL_COMMUNITY): Payer: Self-pay

## 2024-09-02 ENCOUNTER — Encounter (HOSPITAL_COMMUNITY)
Admission: RE | Admit: 2024-09-02 | Discharge: 2024-09-02 | Disposition: A | Discharge status: Home / Self Care | Source: Ambulatory Visit | Attending: Diagnostic Neuroimaging | Admitting: Diagnostic Neuroimaging

## 2024-09-02 DIAGNOSIS — G252 Other specified forms of tremor: Secondary | ICD-10-CM | POA: Insufficient documentation

## 2024-09-02 DIAGNOSIS — R29898 Other symptoms and signs involving the musculoskeletal system: Secondary | ICD-10-CM | POA: Insufficient documentation

## 2024-09-02 DIAGNOSIS — R258 Other abnormal involuntary movements: Secondary | ICD-10-CM | POA: Insufficient documentation

## 2024-09-02 MED ORDER — IOFLUPANE I 123 185 MBQ/2.5ML IV SOLN
5.0000 | Freq: Once | INTRAVENOUS | Status: DC
  Filled 2024-09-02: qty 5

## 2024-09-03 MED ORDER — CARBIDOPA-LEVODOPA 25-100 MG PO TABS: 1.0000 | ORAL_TABLET | Freq: Three times a day (TID) | ORAL | 6 refills | Status: AC

## 2024-09-03 NOTE — Addendum Note (Signed)
 Addended by: MARGARET EDUARD SAUNDERS on: 09/03/2024 08:18 PM   Modules accepted: Orders

## 2024-09-08 ENCOUNTER — Ambulatory Visit: Payer: Self-pay | Admitting: Diagnostic Neuroimaging

## 2024-10-29 ENCOUNTER — Ambulatory Visit: Admitting: Neurology

## 2024-11-04 ENCOUNTER — Ambulatory Visit: Admitting: Diagnostic Neuroimaging

## 2024-11-18 ENCOUNTER — Ambulatory Visit: Admitting: Diagnostic Neuroimaging
